# Patient Record
Sex: Male | Born: 1945 | Race: White | Hispanic: No | Marital: Married | Smoking: Former smoker
Health system: Southern US, Community
[De-identification: ages and names within clinical notes are randomized; demographics above are authoritative.]

## PROBLEM LIST (undated history)

## (undated) DIAGNOSIS — Z862 Personal history of diseases of the blood and blood-forming organs and certain disorders involving the immune mechanism: Secondary | ICD-10-CM

## (undated) DIAGNOSIS — I25810 Atherosclerosis of coronary artery bypass graft(s) without angina pectoris: Secondary | ICD-10-CM

## (undated) DIAGNOSIS — K219 Gastro-esophageal reflux disease without esophagitis: Secondary | ICD-10-CM

## (undated) DIAGNOSIS — E7800 Pure hypercholesterolemia, unspecified: Secondary | ICD-10-CM

## (undated) DIAGNOSIS — I4891 Unspecified atrial fibrillation: Secondary | ICD-10-CM

## (undated) DIAGNOSIS — J449 Chronic obstructive pulmonary disease, unspecified: Secondary | ICD-10-CM

## (undated) DIAGNOSIS — K5090 Crohn's disease, unspecified, without complications: Secondary | ICD-10-CM

## (undated) DIAGNOSIS — I10 Essential (primary) hypertension: Secondary | ICD-10-CM

## (undated) DIAGNOSIS — I6350 Cerebral infarction due to unspecified occlusion or stenosis of unspecified cerebral artery: Secondary | ICD-10-CM

## (undated) DIAGNOSIS — I82409 Acute embolism and thrombosis of unspecified deep veins of unspecified lower extremity: Secondary | ICD-10-CM

## (undated) DIAGNOSIS — N2889 Other specified disorders of kidney and ureter: Secondary | ICD-10-CM

## (undated) HISTORY — DX: Crohn's disease, unspecified, without complications: K50.90

## (undated) HISTORY — DX: Personal history of diseases of the blood and blood-forming organs and certain disorders involving the immune mechanism: Z86.2

## (undated) HISTORY — DX: Atherosclerosis of coronary artery bypass graft(s) without angina pectoris: I25.810

## (undated) HISTORY — DX: Pure hypercholesterolemia, unspecified: E78.00

## (undated) HISTORY — DX: Acute embolism and thrombosis of unspecified deep veins of unspecified lower extremity: I82.409

## (undated) HISTORY — DX: Essential (primary) hypertension: I10

## (undated) HISTORY — DX: Gastro-esophageal reflux disease without esophagitis: K21.9

## (undated) HISTORY — PX: OTHER SURGICAL HISTORY: SHX169

## (undated) HISTORY — DX: Other specified disorders of kidney and ureter: N28.89

## (undated) HISTORY — PX: REPLACEMENT TOTAL KNEE BILATERAL: SUR1225

## (undated) HISTORY — DX: Unspecified atrial fibrillation: I48.91

## (undated) HISTORY — DX: Chronic obstructive pulmonary disease, unspecified: J44.9

## (undated) HISTORY — DX: Cerebral infarction due to unspecified occlusion or stenosis of unspecified cerebral artery: I63.50

---

## 2008-08-13 HISTORY — PX: CORONARY ARTERY BYPASS GRAFT: SHX141

## 2008-08-23 ENCOUNTER — Encounter (INDEPENDENT_AMBULATORY_CARE_PROVIDER_SITE_OTHER): Payer: Self-pay | Admitting: Orthopedic Surgery

## 2008-08-24 ENCOUNTER — Ambulatory Visit: Payer: Self-pay | Admitting: Cardiology

## 2008-08-24 ENCOUNTER — Inpatient Hospital Stay (HOSPITAL_COMMUNITY): Admission: EM | Admit: 2008-08-24 | Discharge: 2008-09-11 | Payer: Self-pay | Admitting: Emergency Medicine

## 2008-08-27 DIAGNOSIS — I4891 Unspecified atrial fibrillation: Secondary | ICD-10-CM

## 2008-08-27 DIAGNOSIS — I82409 Acute embolism and thrombosis of unspecified deep veins of unspecified lower extremity: Secondary | ICD-10-CM | POA: Insufficient documentation

## 2008-08-28 ENCOUNTER — Ambulatory Visit: Payer: Self-pay | Admitting: Oncology

## 2008-08-28 ENCOUNTER — Ambulatory Visit: Payer: Self-pay | Admitting: Cardiothoracic Surgery

## 2008-08-28 ENCOUNTER — Encounter: Payer: Self-pay | Admitting: Cardiothoracic Surgery

## 2008-08-29 ENCOUNTER — Encounter: Payer: Self-pay | Admitting: Cardiothoracic Surgery

## 2008-09-02 DIAGNOSIS — I25810 Atherosclerosis of coronary artery bypass graft(s) without angina pectoris: Secondary | ICD-10-CM | POA: Insufficient documentation

## 2008-09-04 ENCOUNTER — Ambulatory Visit: Payer: Self-pay | Admitting: Cardiothoracic Surgery

## 2008-09-13 ENCOUNTER — Encounter: Admission: RE | Admit: 2008-09-13 | Discharge: 2008-09-13 | Payer: Self-pay | Admitting: Cardiothoracic Surgery

## 2008-09-13 ENCOUNTER — Ambulatory Visit: Payer: Self-pay | Admitting: Cardiothoracic Surgery

## 2008-09-23 ENCOUNTER — Encounter: Admission: RE | Admit: 2008-09-23 | Discharge: 2008-09-23 | Payer: Self-pay | Admitting: Cardiothoracic Surgery

## 2008-09-23 ENCOUNTER — Ambulatory Visit: Payer: Self-pay | Admitting: Cardiothoracic Surgery

## 2008-09-25 ENCOUNTER — Ambulatory Visit: Payer: Self-pay | Admitting: Cardiology

## 2008-09-27 ENCOUNTER — Ambulatory Visit: Payer: Self-pay | Admitting: Hematology & Oncology

## 2008-10-02 ENCOUNTER — Ambulatory Visit: Payer: Self-pay | Admitting: Cardiology

## 2008-10-10 ENCOUNTER — Ambulatory Visit: Payer: Self-pay | Admitting: Cardiology

## 2008-10-10 LAB — CONVERTED CEMR LAB
Chloride: 103 meq/L (ref 96–112)
Creatinine, Ser: 0.7 mg/dL (ref 0.4–1.5)
GFR calc Af Amer: 147 mL/min

## 2008-11-20 ENCOUNTER — Ambulatory Visit: Payer: Self-pay | Admitting: Internal Medicine

## 2008-11-20 ENCOUNTER — Encounter: Payer: Self-pay | Admitting: Urology

## 2008-11-20 ENCOUNTER — Inpatient Hospital Stay (HOSPITAL_COMMUNITY): Admission: AD | Admit: 2008-11-20 | Discharge: 2008-11-24 | Payer: Self-pay | Admitting: Urology

## 2008-11-26 ENCOUNTER — Observation Stay (HOSPITAL_COMMUNITY): Admission: AD | Admit: 2008-11-26 | Discharge: 2008-11-28 | Payer: Self-pay | Admitting: Urology

## 2008-12-11 IMAGING — CR DG ABD PORTABLE 1V
1 series · 1 of 1 positions shown · non-contrast
Comparison: CT abdomen 08/24/2008

CLINICAL DATA: Left renal mass

ABDOMEN - 1 VIEW

[view not recorded]
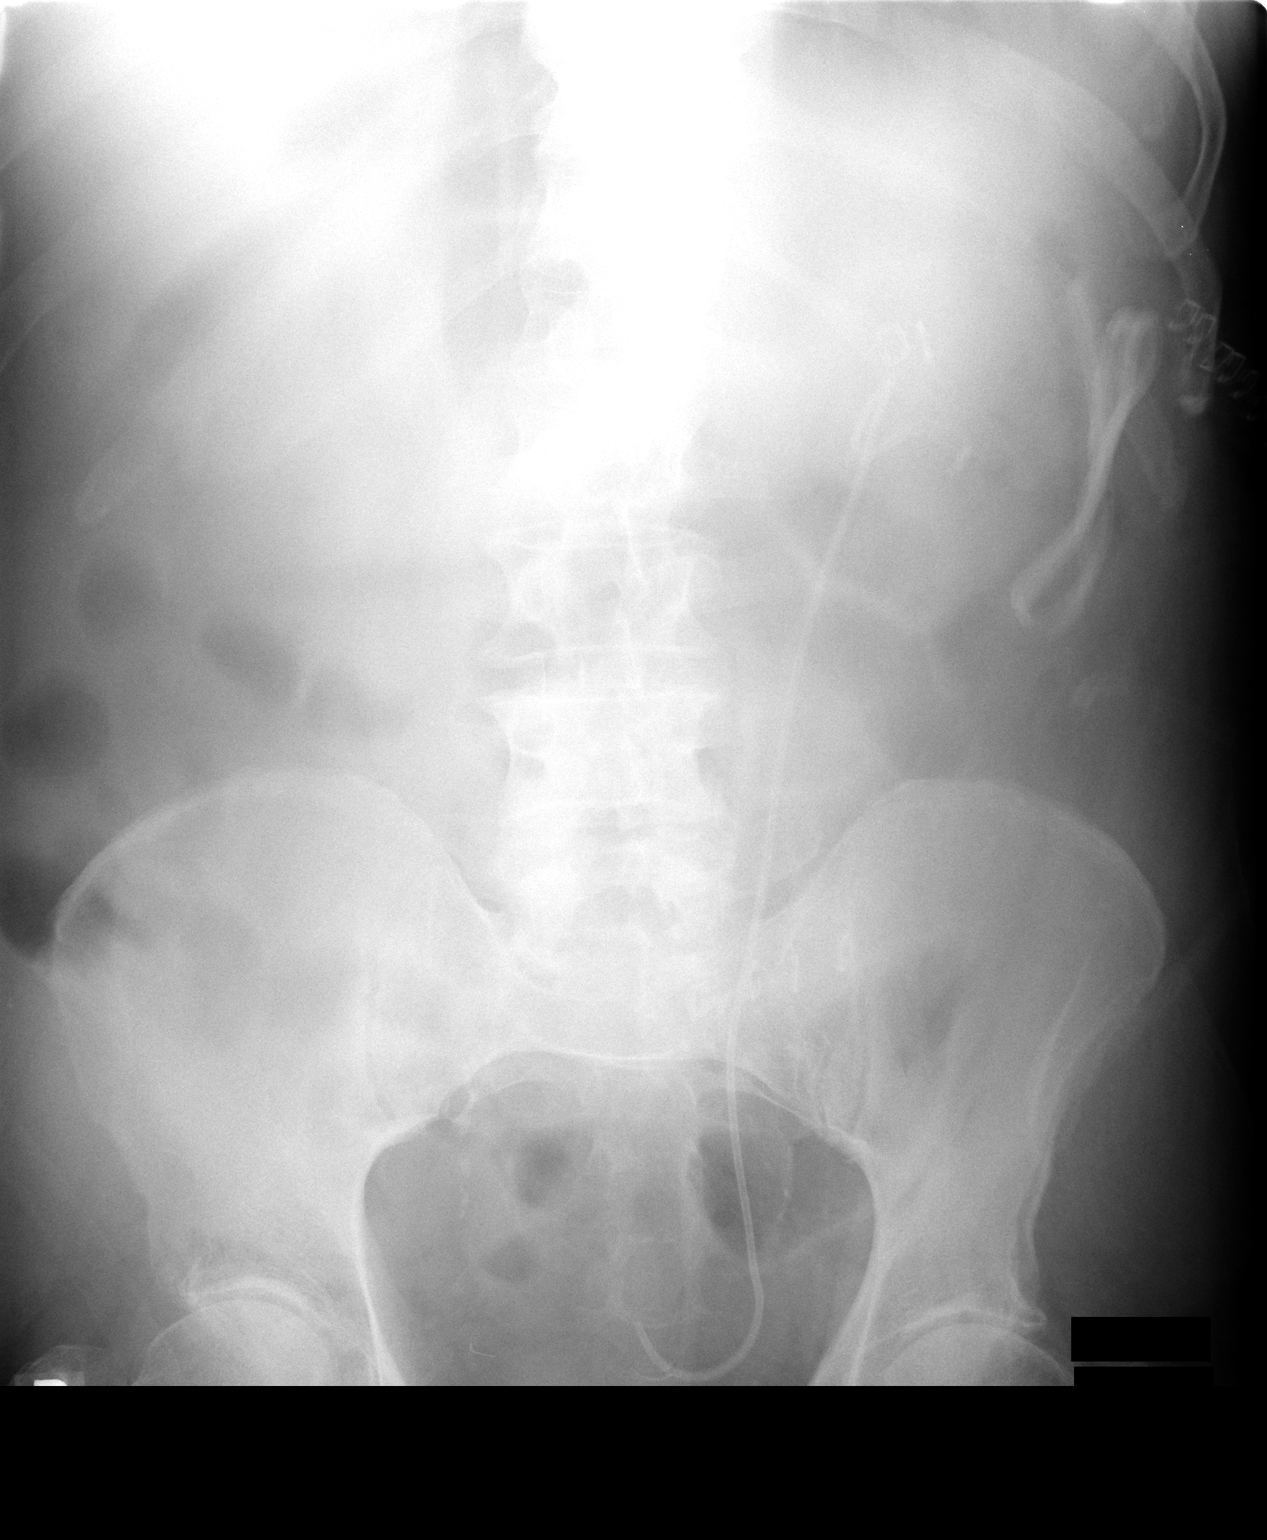

[1 of 1 positions shown; findings below may reference images not displayed]

FINDINGS: Interval placement of left double-J ureteral stent with
tip in the bladder.  Surgical drain lateral to the left kidney.
Skin  staples  in the left  lateral flank.
IMPRESSION: 1. Placement of left double  J ureteral stent.
2.  Surgical drain lateral to left kidney.

## 2008-12-17 IMAGING — CR DG ABDOMEN ACUTE W/ 1V CHEST
3 series · 3 of 3 positions shown · non-contrast
Comparison: 11/20/2008

CLINICAL DATA: Abdominal pain since surgery

ACUTE ABDOMEN SERIES (ABDOMEN 2 VIEW & CHEST 1 VIEW)

[w chest pa]
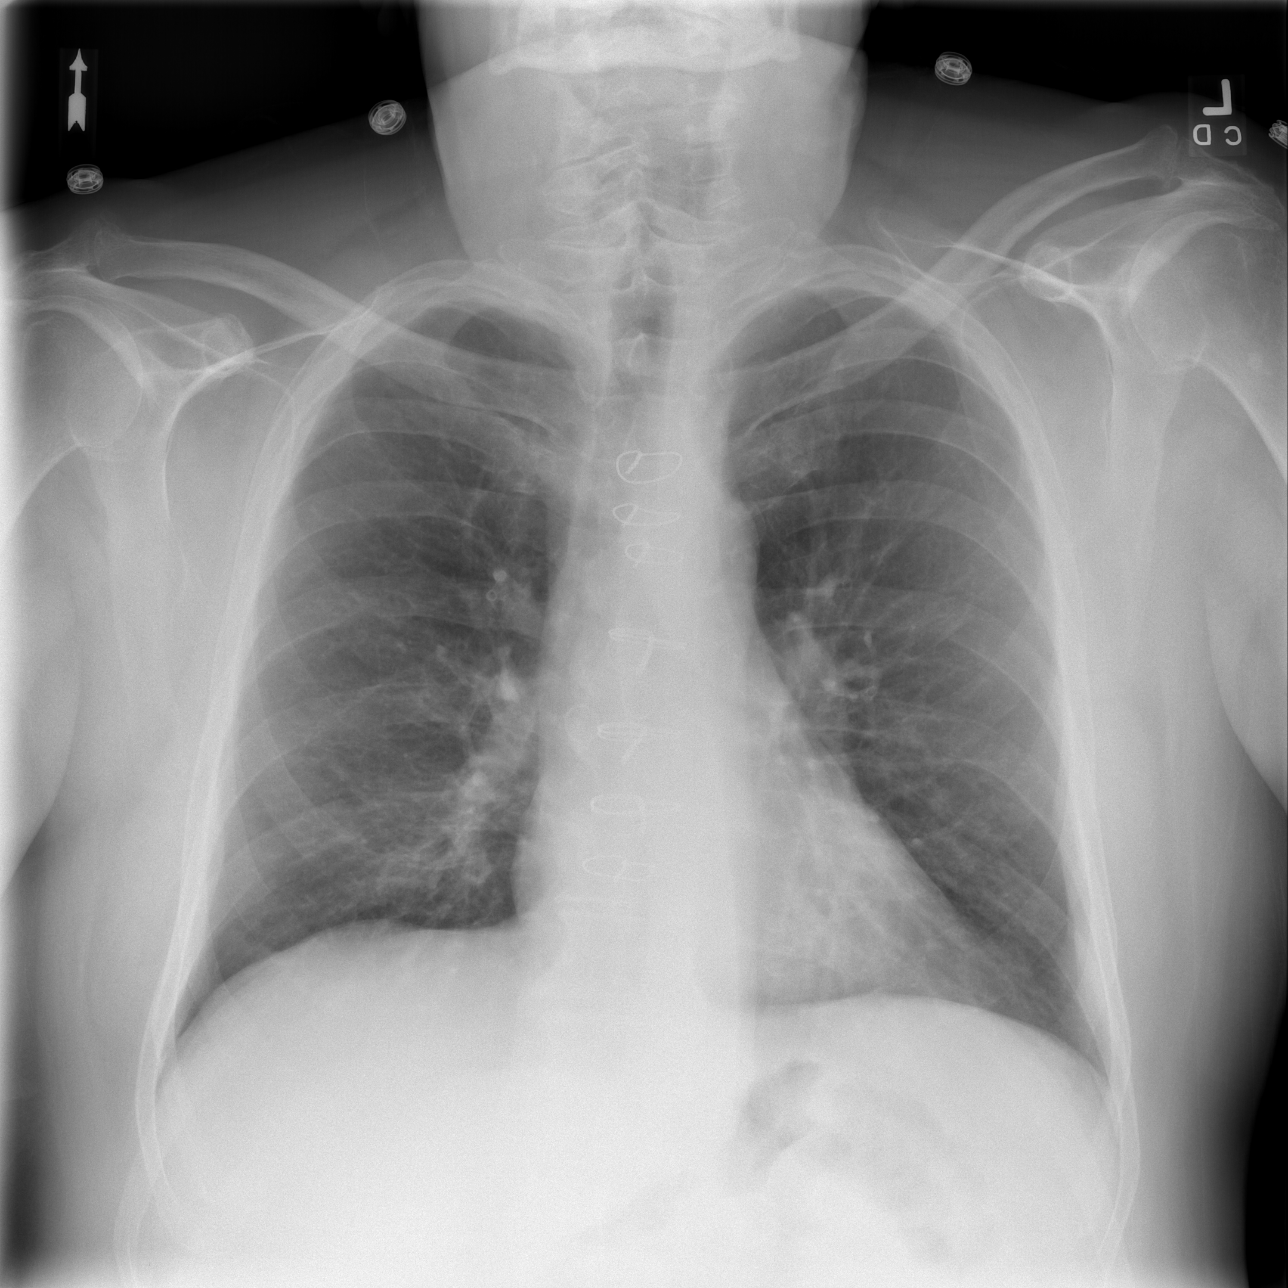

[w abdomen upright *]
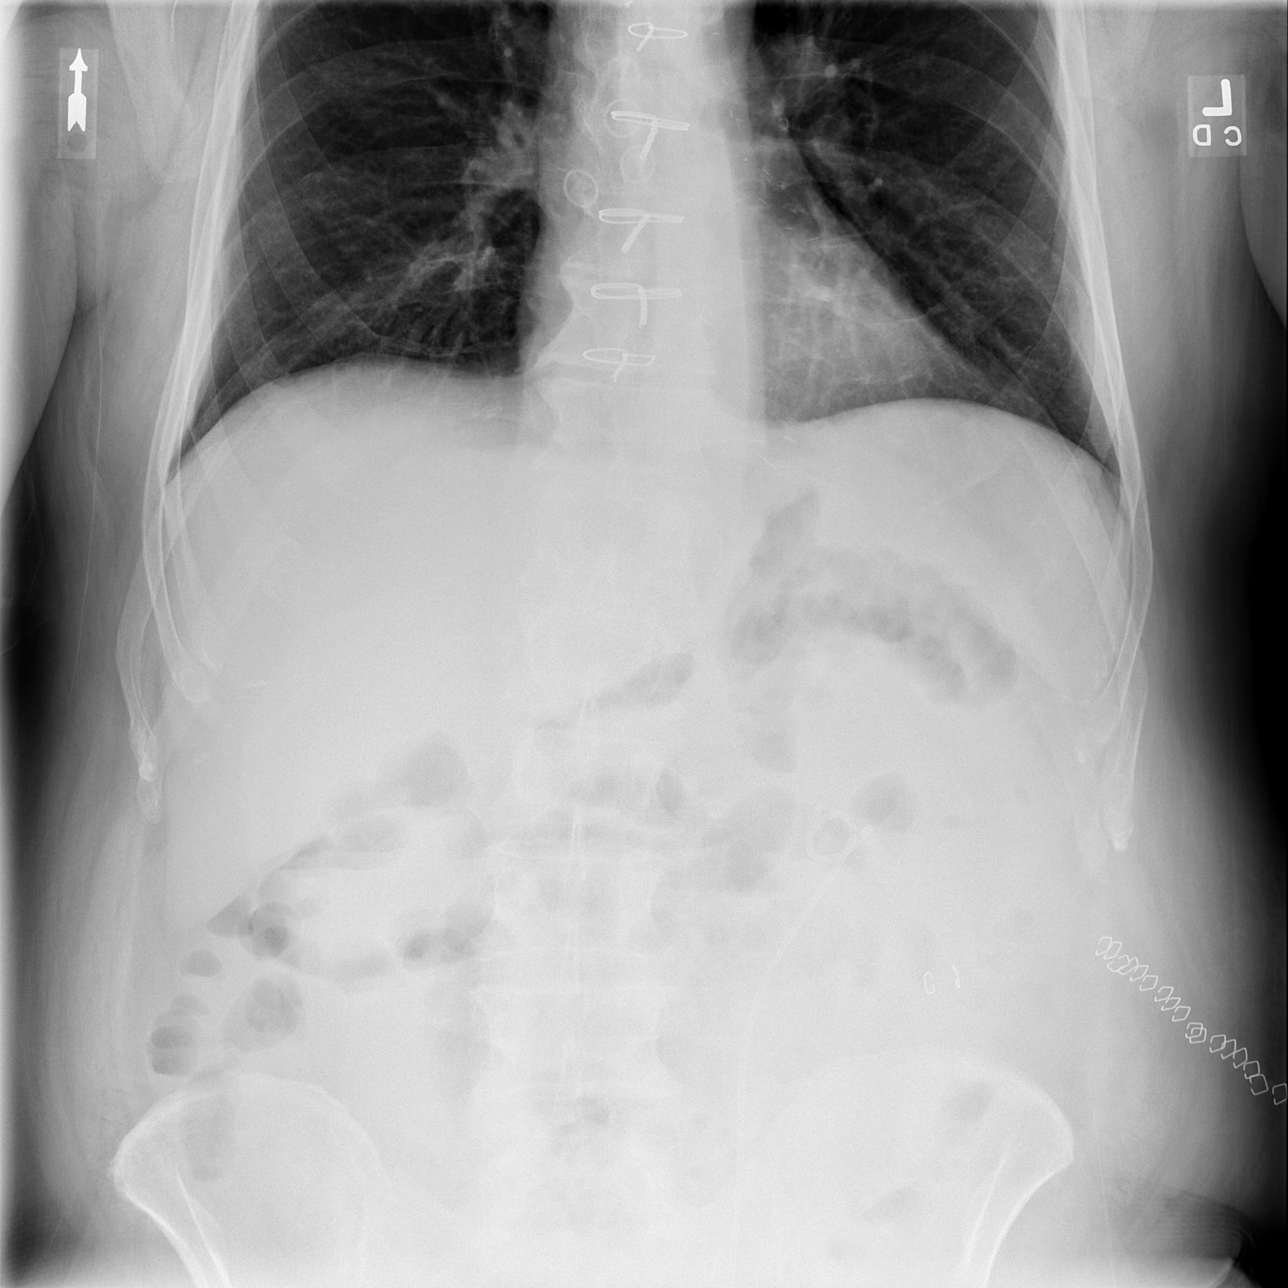

[t abdomen supine]
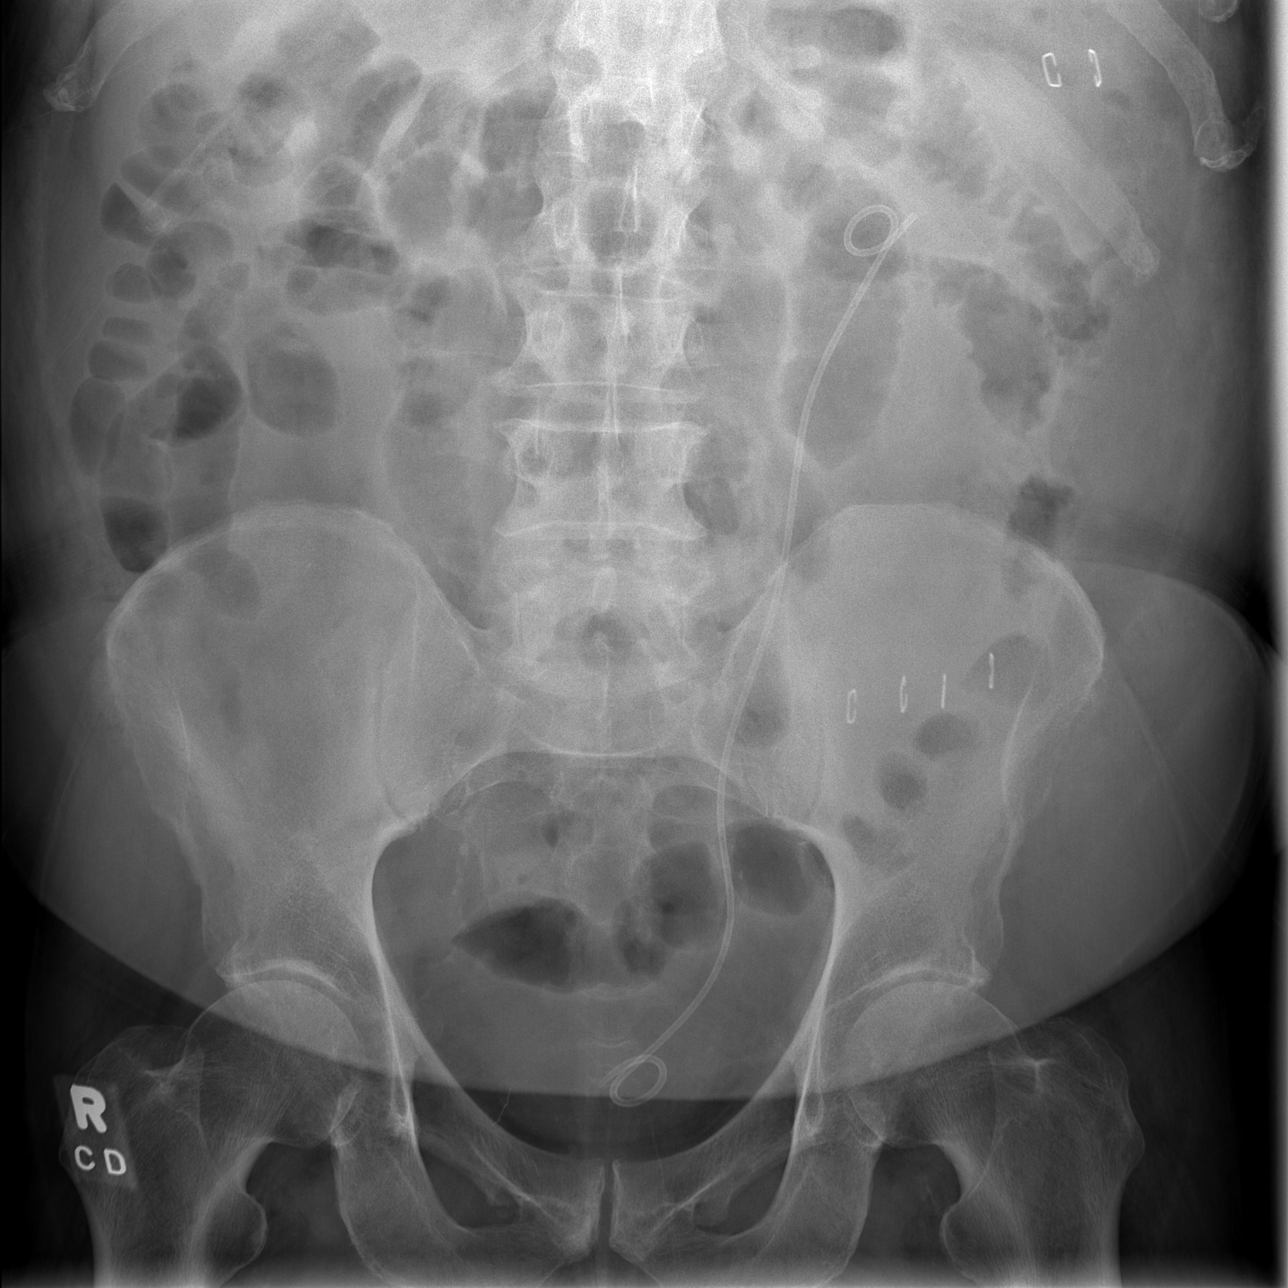

[3 of 3 positions shown; findings below may reference images not displayed]

FINDINGS: Double-J ureteral stent is in place on the left.  There
is gas in small and large bowel but no dilated loops.  The findings
could reflect a mild ileus, but this is not pronounced.  No free
air.  No worrisome calcifications or bony findings.

One-view chest shows previous CABG.  Lungs are clear.  The
vascularity is normal.  No free air.
IMPRESSION: Double-J ureteral stent grossly well positioned.  Gas pattern is
either normal or could possibly reflect a minimal postoperative
ileus.

## 2008-12-18 IMAGING — US US RENAL
1 series · 14 of 23 positions shown · non-contrast
Comparison: CT abdomen and pelvis 08/24/2008.

CLINICAL DATA: Status post repair of the left ureter.

RENAL/URINARY TRACT ULTRASOUND
TECHNIQUE: Complete ultrasound examination of the urinary tract
was performed including evaluation of the kidneys, renal collecting
systems, and urinary bladder.

[Series 1: unknown · 0.30mm/px · 14 of 23 slices shown]
[im 1/23]
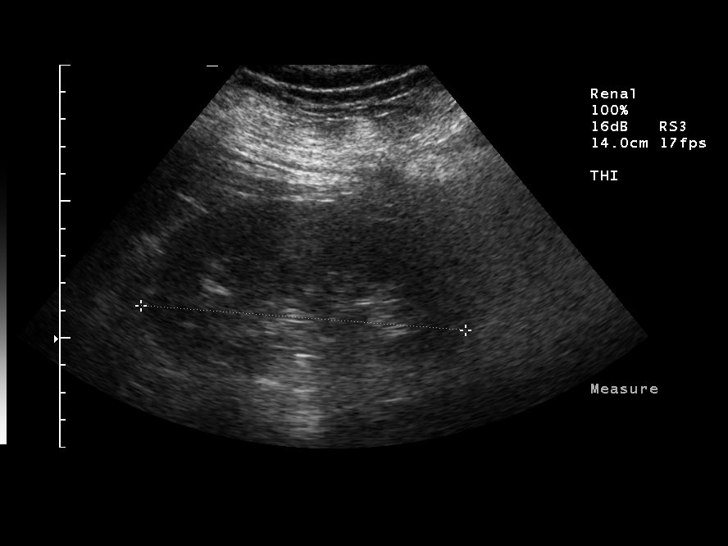
[im 3/23]
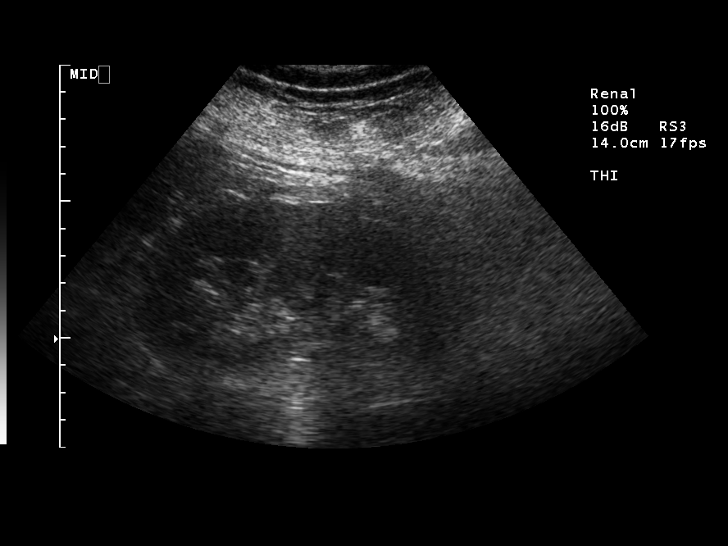
[im 5/23]
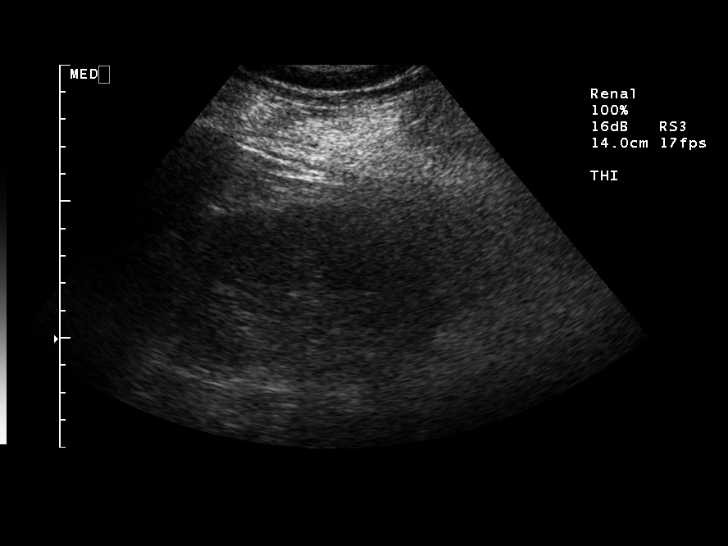
[im 6/23]
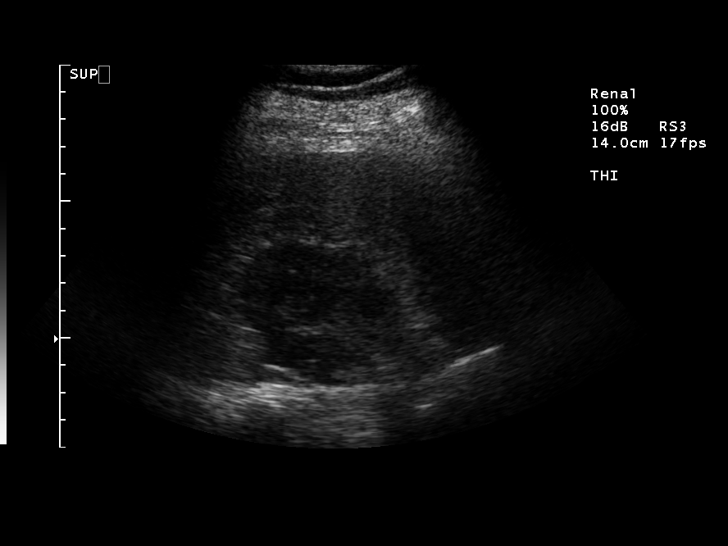
[im 8/23]
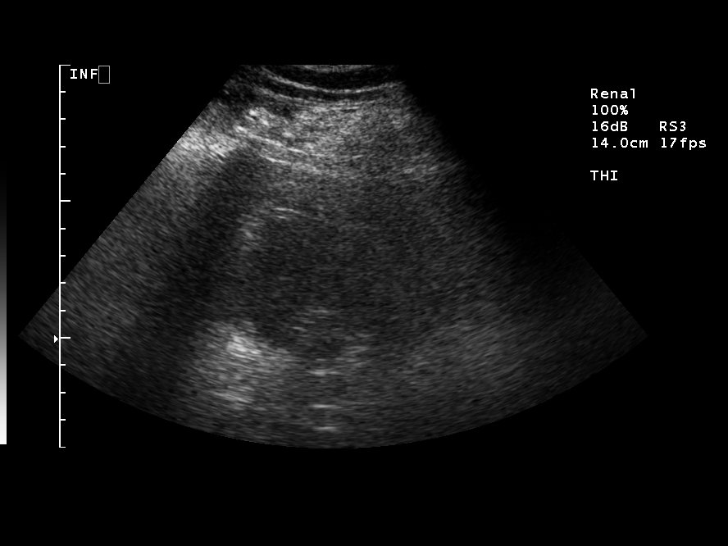
[im 10/23]
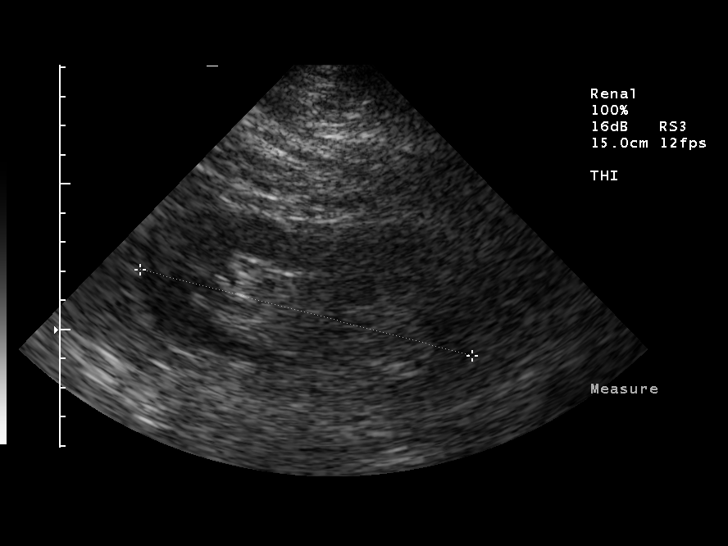
[im 11/23]
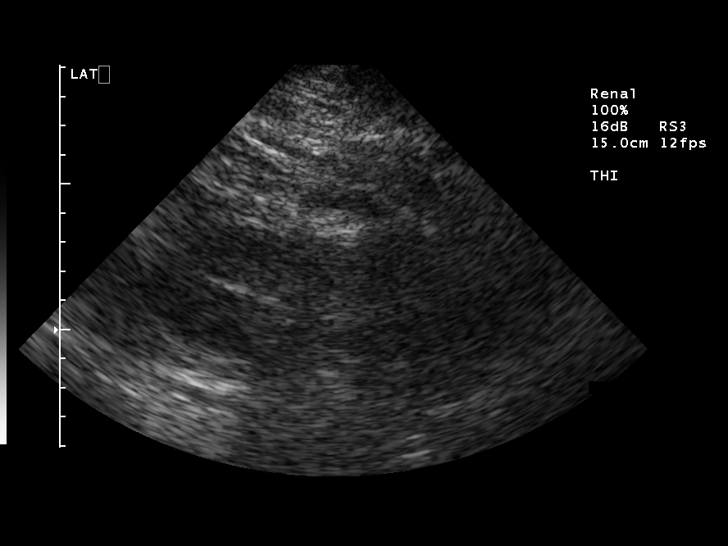
[im 13/23]
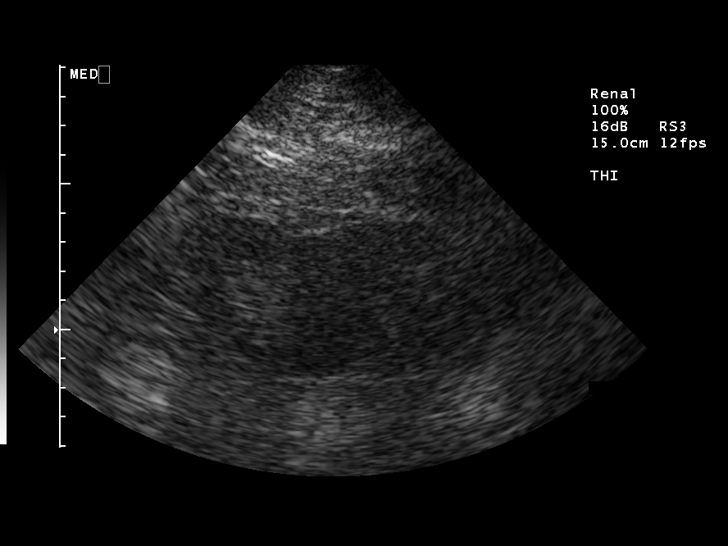
[im 14/23]
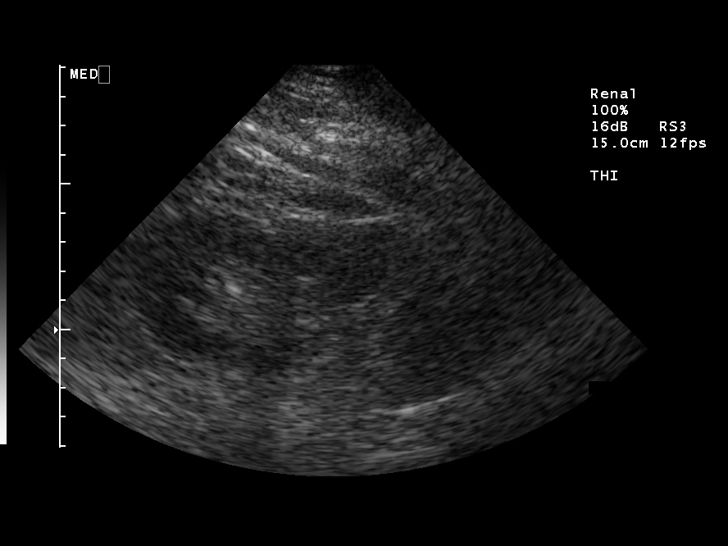
[im 16/23]
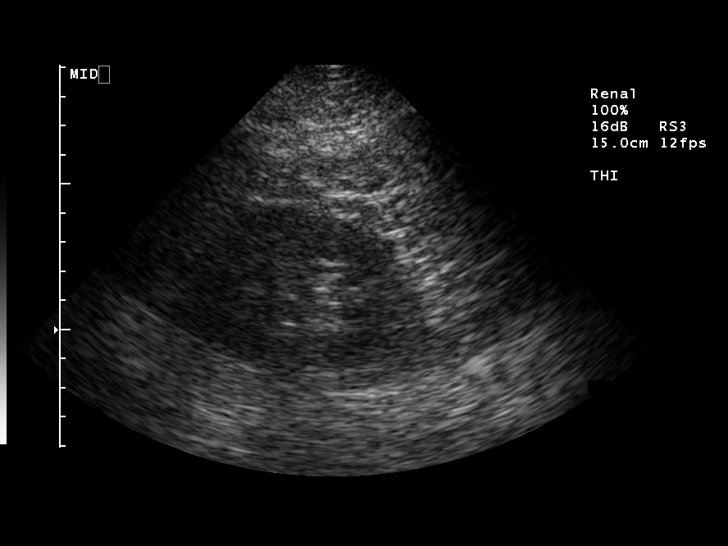
[im 18/23]
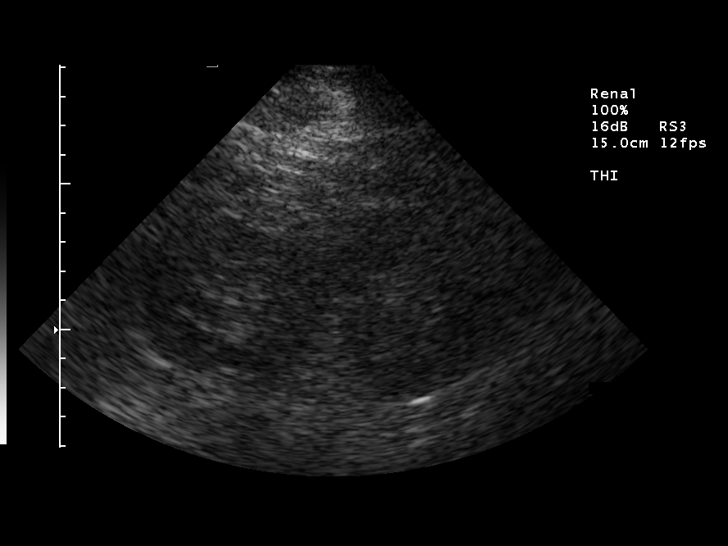
[im 19/23]
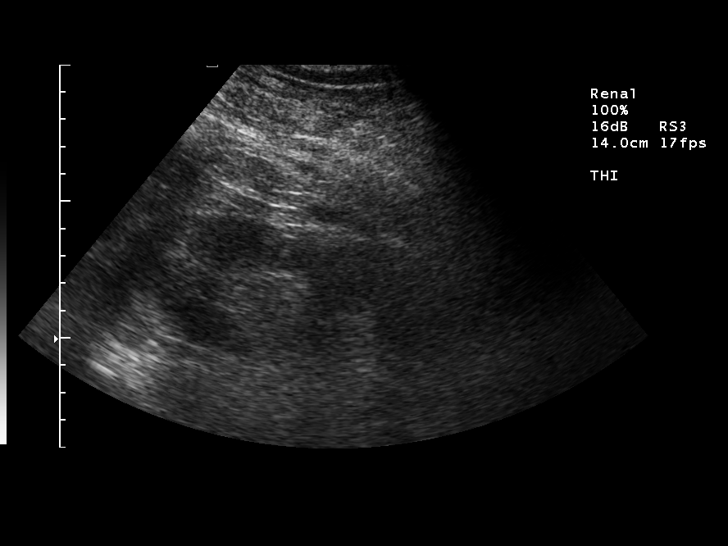
[im 21/23]
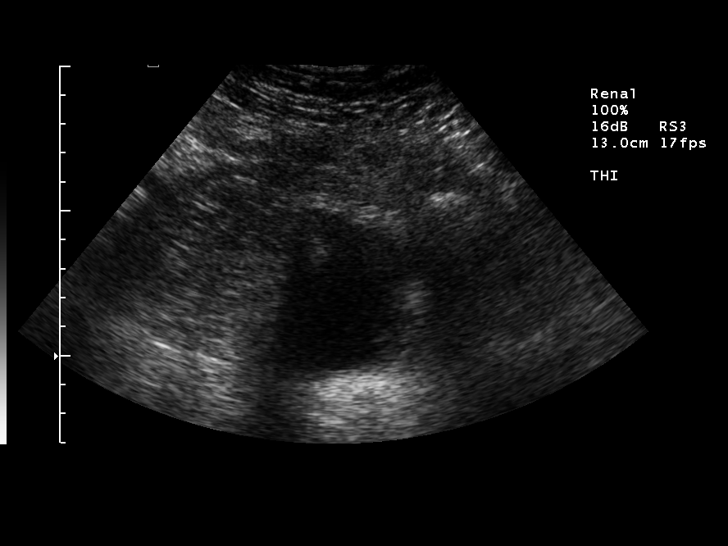
[im 23/23]
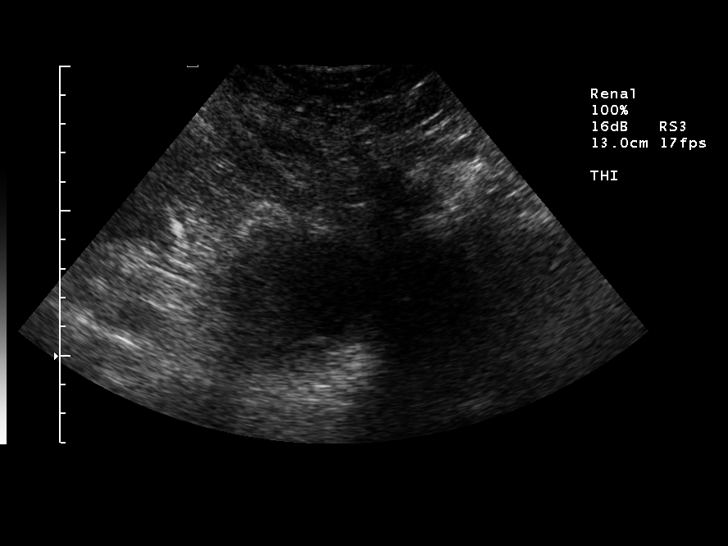

[14 of 23 positions shown; findings below may reference images not displayed]

FINDINGS: The right kidney measures 11.9 cm in left kidney measures
11.7 cm.  No focal fluid collection to suggest urinoma or abscess
is identified.  No renal mass, stone or hydronephrosis on either
the right or left.  Limited visualization of the urinary bladder is
unremarkable.
IMPRESSION: 1.  No acute finding.  Specifically, no evidence of urinoma.

## 2009-01-01 ENCOUNTER — Ambulatory Visit: Payer: Self-pay | Admitting: Cardiology

## 2009-01-01 DIAGNOSIS — I6350 Cerebral infarction due to unspecified occlusion or stenosis of unspecified cerebral artery: Secondary | ICD-10-CM | POA: Insufficient documentation

## 2009-01-01 DIAGNOSIS — K5090 Crohn's disease, unspecified, without complications: Secondary | ICD-10-CM | POA: Insufficient documentation

## 2009-01-01 DIAGNOSIS — K219 Gastro-esophageal reflux disease without esophagitis: Secondary | ICD-10-CM

## 2009-01-01 DIAGNOSIS — E7800 Pure hypercholesterolemia, unspecified: Secondary | ICD-10-CM

## 2009-01-10 ENCOUNTER — Ambulatory Visit: Payer: Self-pay | Admitting: Cardiology

## 2009-01-10 LAB — CONVERTED CEMR LAB
ALT: 21 units/L (ref 0–53)
AST: 20 units/L (ref 0–37)
Calcium: 9 mg/dL (ref 8.4–10.5)
Chloride: 103 meq/L (ref 96–112)
Creatinine, Ser: 0.7 mg/dL (ref 0.4–1.5)
GFR calc non Af Amer: 122 mL/min
Glucose, Bld: 109 mg/dL — ABNORMAL HIGH (ref 70–99)
LDL Cholesterol: 109 mg/dL — ABNORMAL HIGH (ref 0–99)
Pro B Natriuretic peptide (BNP): 109 pg/mL — ABNORMAL HIGH (ref 0.0–100.0)
Total CHOL/HDL Ratio: 4
Total Protein: 6.8 g/dL (ref 6.0–8.3)
Triglycerides: 141 mg/dL (ref 0–149)

## 2009-02-26 ENCOUNTER — Encounter: Payer: Self-pay | Admitting: Cardiology

## 2009-02-26 ENCOUNTER — Ambulatory Visit: Payer: Self-pay | Admitting: Cardiology

## 2009-03-07 ENCOUNTER — Ambulatory Visit: Payer: Self-pay | Admitting: Cardiology

## 2009-03-07 LAB — CONVERTED CEMR LAB
Calcium: 8.3 mg/dL — ABNORMAL LOW (ref 8.4–10.5)
Creatinine, Ser: 0.9 mg/dL (ref 0.4–1.5)
GFR calc non Af Amer: 90.91 mL/min (ref >60)
Potassium: 4.1 meq/L (ref 3.5–5.1)
Sodium: 142 meq/L (ref 135–145)

## 2009-05-19 ENCOUNTER — Telehealth: Payer: Self-pay | Admitting: Cardiology

## 2009-07-07 ENCOUNTER — Encounter (INDEPENDENT_AMBULATORY_CARE_PROVIDER_SITE_OTHER): Payer: Self-pay | Admitting: *Deleted

## 2009-07-23 ENCOUNTER — Encounter: Payer: Self-pay | Admitting: Cardiology

## 2009-07-23 ENCOUNTER — Ambulatory Visit: Payer: Self-pay | Admitting: Cardiology

## 2009-07-23 DIAGNOSIS — J4489 Other specified chronic obstructive pulmonary disease: Secondary | ICD-10-CM | POA: Insufficient documentation

## 2009-07-23 DIAGNOSIS — I509 Heart failure, unspecified: Secondary | ICD-10-CM

## 2009-07-23 DIAGNOSIS — J449 Chronic obstructive pulmonary disease, unspecified: Secondary | ICD-10-CM

## 2009-07-23 DIAGNOSIS — I10 Essential (primary) hypertension: Secondary | ICD-10-CM

## 2009-07-29 ENCOUNTER — Encounter: Payer: Self-pay | Admitting: Cardiology

## 2009-08-01 ENCOUNTER — Encounter (INDEPENDENT_AMBULATORY_CARE_PROVIDER_SITE_OTHER): Payer: Self-pay | Admitting: *Deleted

## 2009-08-05 ENCOUNTER — Telehealth: Payer: Self-pay | Admitting: Cardiology

## 2010-01-20 ENCOUNTER — Telehealth: Payer: Self-pay | Admitting: Cardiology

## 2010-01-20 ENCOUNTER — Encounter: Payer: Self-pay | Admitting: Cardiology

## 2010-02-04 ENCOUNTER — Encounter: Payer: Self-pay | Admitting: Cardiology

## 2010-02-04 ENCOUNTER — Ambulatory Visit: Payer: Self-pay | Admitting: Cardiology

## 2010-07-15 ENCOUNTER — Encounter: Payer: Self-pay | Admitting: Cardiology

## 2010-07-15 ENCOUNTER — Ambulatory Visit: Payer: Self-pay | Admitting: Cardiology

## 2010-07-16 ENCOUNTER — Encounter (INDEPENDENT_AMBULATORY_CARE_PROVIDER_SITE_OTHER): Payer: Self-pay | Admitting: *Deleted

## 2010-07-16 LAB — CONVERTED CEMR LAB
BUN: 15 mg/dL (ref 6–23)
Bilirubin, Direct: 0.1 mg/dL (ref 0.0–0.3)
CO2: 29 meq/L (ref 19–32)
Calcium: 9.8 mg/dL (ref 8.4–10.5)
Cholesterol: 203 mg/dL — ABNORMAL HIGH (ref 0–200)
Indirect Bilirubin: 0.4 mg/dL (ref 0.0–0.9)
Potassium: 4.8 meq/L (ref 3.5–5.3)
Sodium: 137 meq/L (ref 135–145)
Total Bilirubin: 0.5 mg/dL (ref 0.3–1.2)
Total CHOL/HDL Ratio: 4.5
Triglycerides: 237 mg/dL — ABNORMAL HIGH (ref <150)

## 2011-01-12 NOTE — Assessment & Plan Note (Signed)
Summary: [facility]   Visit Type:  Follow-up  CC:  No complains.  History of Present Illness: Mr. Pope is a pleasant gentleman who has history of coronary artery disease, status post coronary bypassing graft in September 2009.  His LV function is preserved. He was also found to have a DVT on that admission and was placed on Coumadin. There is a strong family history of protein S deficiency. The patient was seen by Dr. Myna Hidalgo and Coumadin for one year was recommended. After Coumadin is discontinued  a protein S level will be drawn 2 months afterwards. If positive then he will need lifelong Coumadin. He also was found to have a renal mass, which was ultimately resected and found to be benign by his report.  I last saw him in August of 2010. Since then he has dyspnea with more extreme activities but not with routine activities. It is relieved with rest. There is no associated chest pain. There is no orthopnea or PND. His pedal edema is controlled with his present dose of diuretic. He does not have exertional chest pain and there is no syncope. Note he did have blood drawn recently 3 months after discontinuing Coumadin. He did not have a protein S or protein C deficiency. His potassium was 4.4 and his creatinine was 0.78.  Current Medications (verified): 1)  Folic Acid 800 Mcg Tabs (Folic Acid) .... Take 1 Tablet By Mouth Once A Day 2)  Metoprolol Tartrate 100 Mg Tabs (Metoprolol Tartrate) .... Take 1 Tablet By Mouth Two Times A Day 3)  Ranitidine Hcl 150 Mg Caps (Ranitidine Hcl) .... Take 1 Capsule By Mouth Two Times A Day 4)  Aspirin 81 Mg Tbec (Aspirin) .... Take One Tablet By Mouth Daily 5)  Furosemide 40 Mg Tabs (Furosemide) .... Take One Tablet By Mouth Every Am and 1/2 Tablet At 2pm Daily 6)  Klor-Con M20 20 Meq Cr-Tabs (Potassium Chloride Crys Cr) .... Take 1 Tablet By Mouth Once A Day 7)  Lisinopril 20 Mg Tabs (Lisinopril) .Marland Kitchen.. 1 Tablet By Mouth Once A Day 8)  Pravastatin Sodium 40  Mg Tabs (Pravastatin Sodium) .... Take Two  Tablets  By Mouth Daily At Bedtime  Allergies (verified): No Known Drug Allergies  Past History:  Past Medical History: CAD, ARTERY BYPASS GRAFT (ICD-414.04) HYPERCHOLESTEROLEMIA  IIA (ICD-272.0) CVA (ICD-434.91) 15 years ago CROHN'S DISEASE (ICD-555.9) with rectal bleeding- dx 3 years ago DVT (ICD-453.40) GERD (ICD-530.81) Atrial Fib post CABG History of left renal mass status post resection with results being benign. Hypertension Strong family history of protein S deficiency COPD  Past Surgical History: bilateral knee replacement in 11/08 and 6/08 coronary artery bypass and graft in September of 2009 with a LIMA to the LAD, vein graft to the diagonal, saphenous vein graft to the acute marginal and a saphenous vein graft to the PDA). Prior resection of a renal mass  Social History: Reviewed history from 07/23/2009 and no changes required. Married lives in [place] Tobacco Use - Yes. 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 Remaining systems are negative.   Vital Signs:  Patient profile:   65 year old male Height:      66 inches Weight:      255 pounds BMI:  41.31 Pulse rate:   84 / minute Pulse rhythm:   regular Resp:     18 per minute BP sitting:   140 / 90  (right arm) Cuff size:   large  Vitals Entered By: Vikki Ports (February 04, 2010 10:34 AM)  Physical Exam  General:  Well-developed obese in no acute distress.  Skin is warm and dry.  HEENT is normal.  Neck is supple. No thyromegaly.  Chest is clear to auscultation with normal expansion.  Cardiovascular exam is regular rate and rhythm.  Abdominal exam nontender or distended. No masses  palpated. Extremities show trace edema. neuro grossly intact    EKG  Procedure date:  02/04/2010  Findings:      Normal sinus rhythm at a rate of 84. Axis normal. No ST changes.  Impression & Recommendations:  Problem # 1:  CAD, ARTERY BYPASS GRAFT (ICD-414.04) Continue aspirin, ACE inhibitor, beta blocker and statin. Risk factor modification discussed including diet and exercise. The following medications were removed from the medication list:    Warfarin Sodium 5 Mg Tabs (Warfarin sodium) .Marland Kitchen... As directed His updated medication list for this problem includes:    Metoprolol Tartrate 100 Mg Tabs (Metoprolol tartrate) .Marland Kitchen... Take 1 tablet by mouth two times a day    Aspirin 81 Mg Tbec (Aspirin) .Marland Kitchen... Take one tablet by mouth daily    Lisinopril 20 Mg Tabs (Lisinopril) .Marland Kitchen... 1 tablet by mouth once a day  The following medications were removed from the medication list:    Warfarin Sodium 5 Mg Tabs (Warfarin sodium) .Marland Kitchen... As directed His updated medication list for this problem includes:    Metoprolol Tartrate 100 Mg Tabs (Metoprolol tartrate) .Marland Kitchen... Take 1 tablet by mouth two times a day    Aspirin 81 Mg Tbec (Aspirin) .Marland Kitchen... Take one tablet by mouth daily    Lisinopril 20 Mg Tabs (Lisinopril) .Marland Kitchen... 1 tablet by mouth once a day  Problem # 2:  ESSENTIAL HYPERTENSION, BENIGN (ICD-401.1) Blood pressure elevated. I have asked him to follow this at home. We will increase his lisinopril if his systolic blood pressure runs higher than 130 or if his diastolic runs higher than 85. His updated medication list for this problem includes:    Metoprolol Tartrate 100 Mg Tabs (Metoprolol tartrate) .Marland Kitchen... Take 1 tablet by mouth two times a day    Aspirin 81 Mg Tbec (Aspirin) .Marland Kitchen... Take one tablet by mouth daily    Furosemide 40 Mg Tabs (Furosemide) .Marland Kitchen... Take one tablet by mouth every am and 1/2 tablet at 2pm daily    Lisinopril 20 Mg Tabs (Lisinopril) .Marland Kitchen... 1 tablet by mouth once a day  Problem # 3:   CHF (ICD-428.0) Patient euvolemic on examination. He has diastolic congestive heart failure. Continue present dose of diuretics. The following medications were removed from the medication list:    Warfarin Sodium 5 Mg Tabs (Warfarin sodium) .Marland Kitchen... As directed His updated medication list for this problem includes:    Metoprolol Tartrate 100 Mg Tabs (Metoprolol tartrate) .Marland Kitchen... Take 1 tablet by mouth two times a day    Aspirin 81 Mg Tbec (Aspirin) .Marland Kitchen... Take one tablet by mouth daily    Furosemide 40 Mg Tabs (Furosemide) .Marland Kitchen... Take one tablet by mouth every am and 1/2 tablet at 2pm daily    Lisinopril 20 Mg Tabs (Lisinopril) .Marland Kitchen... 1 tablet by mouth once a day  Problem # 4:  COPD (ICD-496)  Problem # 5:  CROHN'S DISEASE (ICD-555.9)  Problem # 6:  HYPERCHOLESTEROLEMIA  IIA (ICD-272.0) Continue statin. He cannot afford Crestor. He understands that he is not at goal with Pravachol. His updated medication list for this problem includes:    Pravastatin Sodium 40 Mg Tabs (Pravastatin sodium) .Marland Kitchen... Take two  tablets  by mouth daily at bedtime  Problem # 7:  DVT (ICD-453.40) Patient completed his course of Coumadin. His laboratories show no protein S or protein C deficiency.  Patient Instructions: 1)  Your physician recommends that you schedule a follow-up appointment in: 6 MONTHS   Medication Administration  Injection # 1:    Medication: FLU SHOT    Diagnosis: CHF (ICD-428.0)    Route: IM    Site: L deltoid    Exp Date: 06/11/2010    Lot #: [id number]    Mfr: [email or web address]    Patient tolerated injection without complications    Given by: Deliah Goody, RN (February 04, 2010 11:19 AM)

## 2011-01-12 NOTE — Letter (Signed)
Summary: [facility]  [facility], Main Office  [address]   [place], [place] [address]   Phone: [phone]  Fax: [phone]     July 16, 2010 MRN: [id number]   [name] [address] [place], [place]  [address]   Dear Mr. BABE,  We have reviewed your cholesterol results.  They are as follows:     Total Cholesterol:    203 (Desirable: less than 200)       HDL  Cholesterol:     45  (Desirable: greater than 40 for men and 50 for women)       LDL Cholesterol:       111  (Desirable: less than 100 for low risk and less than 70 for moderate to high risk)       Triglycerides:       237  (Desirable: less than 150)  Our recommendations include:These numbers look good. Continue on the same medicine. Sodium, potassium,kidney and Liver function are normal. Take care, Dr. Darel Hong.    Call our office at the number listed above if you have any questions.  Lowering your LDL cholesterol is important, but it is only one of a large number of "risk factors" that may indicate that you are at risk for heart disease, stroke or other complications of hardening of the arteries.  Other risk factors include:   A.  Cigarette Smoking* B.  High Blood Pressure* C.  Obesity* D.   Low HDL Cholesterol (see yours above)* E.   Diabetes Mellitus (higher risk if your is uncontrolled) F.  Family history of premature heart disease G.  Previous history of stroke or cardiovascular disease    *These are risk factors YOU HAVE CONTROL OVER.  For more information, visit .  There is now evidence that lowering the TOTAL CHOLESTEROL AND LDL CHOLESTEROL can reduce the risk of heart disease.  The American Heart Association recommends the following guidelines for the treatment of elevated cholesterol:  1.  If there is now current heart disease and less than two risk factors, TOTAL CHOLESTEROL should be less than 200 and LDL CHOLESTEROL should be less than 100. 2.  If there is  current heart disease or two or more risk factors, TOTAL CHOLESTEROL should be less than 200 and LDL CHOLESTEROL should be less than 70.  A diet low in cholesterol, saturated fat, and calories is the cornerstone of treatment for elevated cholesterol.  Cessation of smoking and exercise are also important in the management of elevated cholesterol and preventing vascular disease.  Studies have shown that 30 to 60 minutes of physical activity most days can help lower blood pressure, lower cholesterol, and keep your weight at a healthy level.  Drug therapy is used when cholesterol levels do not respond to therapeutic lifestyle changes (smoking cessation, diet, and exercise) and remains unacceptably high.  If medication is started, it is important to have you levels checked periodically to evaluate the need for further treatment options.  Thank you,    [employer] Team

## 2011-01-12 NOTE — Progress Notes (Signed)
Summary: lab questions  Phone Note From Other Clinic   Caller: nurse tammy Summary of Call: pt in office to have labs drawn. nurse has question on protein s protein c Liver. [phone] ask for tammy Initial call taken by: Edman Circle,  January 20, 2010 9:27 AM  Follow-up for Phone Call        I called and spoke with Tammy. The pt has a lab order from our office for Protein S and Protein C to be done with a liver test. She wanted to clarify which protein S/ C test were needed- there is protein S anitgen/ deficiency profile/functional panels and the same for protein C. I called and spoke with Dr. Jens Som. He wants the Protein C and Protein S deficiency profiles. Tammy is aware and she will get a liver function panel as well.  Follow-up by: Sherri Rad, RN, BSN,  January 20, 2010 9:45 AM

## 2011-01-12 NOTE — Assessment & Plan Note (Signed)
Summary: [facility]   Visit Type:  6 months follow up  CC:  Sob.  History of Present Illness: [name] is a pleasant gentleman who has history of coronary artery disease, status post coronary bypassing graft in September 2009.  His LV function is preserved. He was also found to have a DVT on that admission and was placed on Coumadin. There is a strong family history of protein S deficiency. The patient was seen by Dr. Myna Hidalgo and Coumadin for one year was recommended. Note he did have blood drawn after discontinuing Coumadin. He did not have a protein S or protein C deficiency. He also was found to have a renal mass, which was ultimately resected and found to be benign by his report.  I last saw him in Feb 2011. Since then, the patient has dyspnea with more extreme activities but not with routine activities. It is relieved with rest. It is not associated with chest pain. There is no orthopnea, PND or pedal edema. There is no syncope or palpitations. There is no exertional chest pain.   Current Medications (verified): 1)  Folic Acid 800 Mcg Tabs (Folic Acid) .... Take 1 Tablet By Mouth Once A Day 2)  Metoprolol Tartrate 100 Mg Tabs (Metoprolol Tartrate) .... Take 1 Tablet By Mouth Two Times A Day 3)  Ranitidine Hcl 150 Mg Caps (Ranitidine Hcl) .... Take 1 Capsule By Mouth Two Times A Day 4)  Aspirin 81 Mg Tbec (Aspirin) .... Take One Tablet By Mouth Daily 5)  Furosemide 40 Mg Tabs (Furosemide) .... Take One Tablet By Mouth Every Am and 1/2 Tablet At 2pm Daily 6)  Klor-Con M20 20 Meq Cr-Tabs (Potassium Chloride Crys Cr) .... Take 1 Tablet By Mouth Once A Day 7)  Lisinopril 20 Mg Tabs (Lisinopril) .Marland Kitchen.. 1 Tablet By Mouth Once A Day 8)  Pravastatin Sodium 40 Mg Tabs (Pravastatin Sodium) .... Take Two  Tablets  By Mouth Daily At Bedtime 9)  Sulfasalazine 500 Mg Tabs (Sulfasalazine) .... Take 2 Tablets Two Times A Day  Allergies (verified): No Known Drug Allergies  Past History:  Past  Medical History: Reviewed history from 02/04/2010 and no changes required. CAD, ARTERY BYPASS GRAFT (ICD-414.04) HYPERCHOLESTEROLEMIA  IIA (ICD-272.0) CVA (ICD-434.91) 15 years ago CROHN'S DISEASE (ICD-555.9) with rectal bleeding- dx 3 years ago DVT (ICD-453.40) GERD (ICD-530.81) Atrial Fib post CABG History of left renal mass status post resection with results being benign. Hypertension Strong family history of protein S deficiency COPD  Past Surgical History: Reviewed history from 02/04/2010 and no changes required. bilateral knee replacement in 11/08 and 6/08 coronary artery bypass and graft in September of 2009 with a LIMA to the LAD, vein graft to the diagonal, saphenous vein graft to the acute marginal and a saphenous vein graft to the PDA). Prior resection of a renal mass  Social History: Reviewed history from 07/23/2009 and no changes required. Married lives in [place] Tobacco Use - Yes. 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 Remaining systems are negative.   Vital Signs:  Patient profile:   65 year old male Height:      66 inches Weight:      247.75 pounds BMI:     40.13 Pulse rate:  77 / minute Pulse rhythm:   regular Resp:     20 per minute BP sitting:   128 / 80  (left arm) Cuff size:   large  Vitals Entered By: Vikki Ports (July 15, 2010 8:41 AM)  Physical Exam  General:  Well-developed well-nourished in no acute distress.  Skin is warm and dry.  HEENT is normal.  Neck is supple. No thyromegaly.  Chest is clear to auscultation with normal expansion.  Cardiovascular exam is regular rate and rhythm.  Abdominal exam nontender or distended. No masses  palpated. Extremities show no edema. neuro grossly intact    EKG  Procedure date:  07/15/2010  Findings:      Normal sinus rhythm at a rate of 77. Access to the right. No ST changes.  Impression & Recommendations:  Problem # 1:  ESSENTIAL HYPERTENSION, BENIGN (ICD-401.1)  Blood pressure controlled on present medications; will continue. Check potassium and renal function. His updated medication list for this problem includes:    Metoprolol Tartrate 100 Mg Tabs (Metoprolol tartrate) .Marland Kitchen... Take 1 tablet by mouth two times a day    Aspirin 81 Mg Tbec (Aspirin) .Marland Kitchen... Take one tablet by mouth daily    Furosemide 40 Mg Tabs (Furosemide) .Marland Kitchen... Take one tablet by mouth every am and 1/2 tablet at 2pm daily    Lisinopril 20 Mg Tabs (Lisinopril) .Marland Kitchen... 1 tablet by mouth once a day  Orders: T-Basic Metabolic Panel [id number])  Problem # 2:  CAD, ARTERY BYPASS GRAFT (ICD-414.04) Continue aspirin, beta blocker, ACE inhibitor and statin. Discussed risk factor modification including diet and exercise. His updated medication list for this problem includes:    Metoprolol Tartrate 100 Mg Tabs (Metoprolol tartrate) .Marland Kitchen... Take 1 tablet by mouth two times a day    Aspirin 81 Mg Tbec (Aspirin) .Marland Kitchen... Take one tablet by mouth daily    Lisinopril 20 Mg Tabs (Lisinopril) .Marland Kitchen... 1 tablet by mouth once a day  Problem # 3:  HYPERCHOLESTEROLEMIA  IIA (ICD-272.0)  Continue statin. Check lipids and liver. Patient cannot afford Crestor. His updated medication list for this problem includes:    Pravastatin Sodium 40 Mg Tabs (Pravastatin sodium) .Marland Kitchen... Take two  tablets  by mouth daily at bedtime  His updated medication list for this problem includes:    Pravastatin Sodium 40 Mg Tabs (Pravastatin sodium) .Marland Kitchen... Take two  tablets  by mouth daily at bedtime  Orders: T-Hepatic Function [id number]) T-Lipid Profile [id number])  Problem # 4:  CROHN'S DISEASE (ICD-555.9)  Problem # 5:  COPD (ICD-496) Pt does  complain of dyspnea. I think this is most likely secondary to COPD. He is not volume overloaded on examination. Check BNP.  Other Orders: T-BNP  (B Natriuretic Peptide) ([id number])  Patient Instructions: 1)  Your physician recommends that you schedule a follow-up appointment in: ONE YEAR

## 2011-04-05 ENCOUNTER — Other Ambulatory Visit: Payer: Self-pay | Admitting: Cardiology

## 2011-04-27 NOTE — Consult Note (Signed)
[name], [name]                ACCOUNT NO.:  [id number]   MEDICAL RECORD NO.:  [id number]          PATIENT TYPE:  INP   LOCATION:  3702                         FACILITY:  [facility]   PHYSICIAN:  Wesley Frieze. Jens Som, MD, FACCDATE OF BIRTH:  Jul 10, 1946   DATE OF CONSULTATION:  08/27/2008  DATE OF DISCHARGE:                                 CONSULTATION   PRIMARY CARDIOLOGIST:  Wesley Frieze. Jens Som, MD, [facility]   PRIMARY CARE PHYSICIAN:  Wesley Guard, MD in [place].   REQUESTING PHYSICIAN:  Wesley Lavera Guise, MD of [facility].   REASON FOR CONSULTATION:  Chest pain.   HISTORY OF PRESENT ILLNESS:  A 65 year old Caucasian male with  complaints of lower extremity edema over the last 4 days who was seen by  his primary care physician, [name] and was found to have positive  DVT per Dopplers in his office.  The patient has cardiovascular risk  factors to include hypertension, hypercholesterolemia, and tobacco and  has been having exertional chest pain x1 year.  He describes this as a  dull pain in his upper chest with walking, also after carrying something  heavy or eating with some associated nausea, and states the pain lasts 5  minutes, occurs with occasional shortness of breath, and goes away with  rest.  He noticed the symptoms of increased shortness of breath after  starting his Crohn medication, as he had been diagnosed within the last  2 years.  He states that the pain sometimes radiates to his shoulders  and, it has just been ongoing with no change in intensity.  His DVT  symptoms, however, has only been over the last 3-4 days.  He has not had  any lower extremity pain.  He just noticed some lower extremity edema on  the left and had it checked out by his physician thinking it might have  been a mild infection.   REVIEW OF SYSTEMS:  Positive for exertional chest pain, dyspnea on  exertion, lower extremity edema, and low-grade nausea associated.   PAST MEDICAL HISTORY:  1. Crohn  disease, diagnosed 3 years ago.  2. GI bleeding secondary to Crohn disease.  3. Hypertension.  4. GERD.  5. CVA, 15 years ago.   PAST SURGICAL HISTORY:  Bilateral knee replacement in November 2008 and  in June 2008.   SOCIAL HISTORY:  He lives in [place] with his wife.  He is an  Probation officer.  He is a 50-pack-year smoker but chews for the last 3  years.  Negative for EtOH or drug use.  He is not very active.   FAMILY HISTORY:  Mother with protein S deficiency and deceased with a  CVA.  Father deceased with a brain tumor.  Brothers and sisters without  history of CAD.   CURRENT MEDICATIONS:  1. Coumadin.  2. Senokot.  3. Vasotec 40 mg once a day.  4. Sulfasalazine 2000 mg a day.  5. Atenolol 100 mg daily.  6. Folic acid 1 mg daily.  7. The patient is on a heparin drip, bridged to Coumadin.  8. Zantac 150 mg a day.  9. Zocor 40 mg a day.   ALLERGIES:  No known drug allergies.   CURRENT LABORATORY DATA:  Troponin 0.01, CK 96, and MB 1.9.  Sodium 141,  potassium 4.2, chloride 104, CO2 of 31, BUN 9, creatinine 0.62, and  glucose 104.  Hemoglobin 12.5, hematocrit 37.4, white blood cells 7.7,  and platelets 264.  PT 17.9 and INR 1.4.  Total cholesterol 241,  triglycerides 196, HDL 42, and LDL 160.   CARDIOVASCULAR RISK FACTORS:  Hypertension, hypercholesterolemia, and  tobacco abuse.   PHYSICAL EXAMINATION:  VITAL SIGNS:  Blood pressure 138/80, pulse 70,  respirations 18, temperature 97.8, and O2 sat 95% on room air.  HEENT:  Head is normocephalic and atraumatic.  Eyes, PERRLA.  Mucous  membranes in mouth pink and moist.  Tongue is midline.  NECK:  Supple.  There is no JVD or carotid bruits appreciated.  CARDIOVASCULAR:  Regular rate and rhythm with a soft S1 murmur  auscultated.  Pulses are 2+ and equal without bruits.  LUNGS:  Clear to auscultation.  ABDOMEN:  Soft, nontender with 2+ bowel sounds.  Hernia is noted.  EXTREMITIES:  Without clubbing, cyanosis, or edema.   NEUROLOGIC:  Cranial nerves II through XII are grossly intact.   Chest x-ray, CT of the abdomen reveals a 12-mm __________ lesion from  the left kidney.  Renal ultrasound revealed a 2-cm lesion in the lower  pole of the left kidney.  Small cell carcinoma could not be excluded.  EKG revealing sinus bradycardia with ventricular rate of 59 beats per  minute.   IMPRESSION:  1. Left lower extremity deep vein thrombosis.  2. Chest pain, classic for angina.  3. Hypertension.  4. History of cerebrovascular accident (15 years ago).  5. Hypercholesterolemia, newly diagnosed.  6. Tobacco abuse.   PLAN:  This is a 65 year old Caucasian male who was admitted with  diagnosis of left lower leg DVT and complaints of chest pain with  exertion x1 year with multiple cardiovascular risk factors.  He was  admitted on August 23, 2008, secondary to positive ultrasound  revealed DVT in physician's office.  The patient has a strong family  history of protein S deficiency.  He also complains of chest pain with  exertion relieved with rest x1 year with increasing severity, none at  rest.  The patient has limited his activities due to the above.  The  patient has also been diagnosed with left renal mass, questionable  malignancy.   This is a very complicated case, the patient with classic angina, now  limiting activities.  He may also have renal malignancy, questionable  need for nephrectomy, eventually would need preop clearance.  However,  he also has new DVT and also with Crohn disease, any PCI would require  at least a short-term aspirin and Plavix in addition to Coumadin for  DVT, which would exacerbate hematochezia.   I have discussed the issues with the patient including risks and  benefits of the cardiac catheterization.  He will receive his cardiac  catheterization in the a.m. to define anatomy.  This has been discussed  with Dr. Charlies Constable.  We will hold his Coumadin for now but continue   heparin.  We will add enteric-coated aspirin 81 mg daily.  Further  recommendations will be based on catheterization results.  We will  resume Coumadin after coronaries were addressed.   No evaluation of renal mass planned but may need neurology consult at  your discretion.  We would ask Hematology  to see to evaluate for  possible hypercoagulable state workup with questionable DVT secondary to  this and protein S deficiency, also secondary to renal malignancy.      Bettey Mare. Lyman Bishop, NP      Wesley Frieze. Jens Som, MD, [facility]  Electronically Signed    KML/MEDQ  D:  08/27/2008  T:  08/28/2008  Job:  [id number]   cc:   [name], M.D.

## 2011-04-27 NOTE — H&P (Signed)
[name], [name]                ACCOUNT NO.:  [id number]   MEDICAL RECORD NO.:  [id number]          PATIENT TYPE:  OBV   LOCATION:  1413                         FACILITY:  [facility]   PHYSICIAN:  Bertram Millard. Dahlstedt, M.D.DATE OF BIRTH:  05/15/1946   DATE OF ADMISSION:  11/26/2008  DATE OF DISCHARGE:                              HISTORY & PHYSICAL   REASON FOR ADMISSION:  Abdominal discomfort, constipation, status post  left partial nephrectomy,   BRIEF HISTORY:  [name] is a 65 year old male who is about a week out  from an attempted laparoscopic cryoablation.  He had a ureteral injury  identified during the procedure, and this was converted to an open  ureteral repair and partial nephrectomy.  He stayed 3 days  postoperatively.  He left over the preceding weekend.  He presented in  my office yesterday afternoon with abdominal discomfort, distention and  decreased stool production.  He was fairly uncomfortable.  Due to the  patient's status, I felt it best to admit him for hydration and pain  management.  There is no history of fever.  He does not have gross  hematuria.  He has not had significant nausea or vomiting.  He has been  passing a small amount of stool and occasional flatus.   PAST MEDICAL HISTORY:  His medical history is significant for recent  total knee arthroplasty.  He had a DVT postop and a pulmonary embolus.  Evaluation revealed the patient had coronary artery disease and he  underwent a coronary artery bypass graft.  He has been on Lovenox and  Coumadin.  His a history of hypertension, GERD and Crohn's disease.   MEDICATIONS:  Include aspirin, atenolol, Lovenox, Coumadin 5 mg a day,  Diltiazem, enalapril, folic acid, Naprosyn, ranitidine and  sulfasalazine.  There are no known drug allergies.   The patient is married and has children.  He denies smoking, but does  chew tobacco.  He denies drinking.   REVIEW OF SYSTEMS:  Is significant for abdominal discomfort  and  distention.  He has no chest pain.  He has no significant peripheral  edema.   EXAM:  In my office revealed the patient to be slightly uncomfortable.  Breathing was normal.  LUNGS:  Were clear bilaterally.  HEART:  Normal rate and rhythm.  ABDOMEN:  Was protuberant, soft, nondistended with minimal generalized  tenderness.  Incisions were all healing well without evidence of redness  or drainage.   LABORATORIES ON ADMISSION:  INR was 2.6, pro time 29.9.  Basic metabolic  profile was normal except for a bicarb of 34.  Creatinine was normal at  0.98.  CBC revealed a normal white count, hematocrit of 33% and normal  platelet count.   In the office he was found to have pyuria.  Urine was cultured in the  office.  Abdominal series revealed mild picture of an ileus.  No evidence of  obstruction.  Chest reveals previous CABG, lungs clear.   IMPRESSION:  1. Status post left partial nephrectomy with ureteral injury and      repair.  Final pathology revealed  a cystic change,      no evidence of neoplasm.  2. Possible ileus.  We will rule out urinary leak with eventual      ultrasound or CT.  3. Admit for intravenous hydration and pain management.      Bertram Millard. Dahlstedt, M.D.  Electronically Signed     SMD/MEDQ  D:  11/27/2008  T:  11/27/2008  Job:  [id number]

## 2011-04-27 NOTE — Assessment & Plan Note (Signed)
[facility]                            CARDIOLOGY OFFICE NOTE   JAYMERE, ALEN                       MRN:          [id number]  DATE:01/01/2009                            DOB:          1946/07/10    [name] is a pleasant gentleman who has a history of coronary artery  disease.  The patient underwent coronary bypass and graft in September  2009.  During that admission, he had been found to have a DVT and was  placed on Coumadin as well.  He also was found to have renal mass.  His  postoperative course was complicated by atrial fibrillation and he was  placed on amiodarone.  Since I last saw him on September 25, 2008, the  patient has undergone surgical procedure for his kidney mass and was  found to be benign by his report.  He is slowly recovering from that  surgery.  However, since I saw him on September 25, 2008, he does state  that his pedal edema has improved.  He does have dyspnea on exertion,  but this is a chronic issue and unchanged since September 25, 2008.  He  attributes this to COPD.  Note, there is no orthopnea or PND.  There is  no associated chest pain, productive cough, fevers, or chills.  It  resolves immediately with rest.   CURRENT MEDICATIONS:  1. Sulfasalazine.  2. Folate.  3. Lopressor 100 mg p.o. b.i.d.  4. Zantac 150 mg p.o. b.i.d.  5. Aspirin 81 mg p.o. daily.  6. Coumadin as directed.  7. Pacerone 200 mg p.o. daily.  8. Pravachol 80 mg p.o. daily.  9. Lasix 40 mg p.o. daily.  10.Potassium 10 mEq p.o. daily.   PHYSICAL EXAMINATION:  VITAL SIGNS:  Blood pressure 150/100 and his  pulse is 73.  He weighs 235 pounds.  HEENT:  Normal.  NECK:  Supple.  CHEST:  Clear.  CARDIOVASCULAR:  Regular rhythm.  His sternotomy is without evidence of  infection.  ABDOMEN:  He is status post abdominal surgery.  EXTREMITIES:  Show trace edema.   His electrocardiogram shows a sinus rhythm at a rate of 74.  There are  no ST changes  noted.   DIAGNOSES:  1. Coronary artery disease status post coronary bypass and graft - Mr.      Kindred continues to do well from a symptomatic standpoint.  He will      continue on his aspirin, beta-blocker, and statin.  He has asked      about returning to work.  From a cardiac standpoint, I think this      would be fine.  I have asked him to contact urologist concerning      his recent abdominal surgery and also the CVTS surgeons concerning      the stability of his sternum.  If they have no objection, then I      think he can return to work without restriction.  I have asked him      not to lift heavy objects for 4-6 weeks due to his previous  sternotomy.  2. Postoperative atrial fibrillation - the patient remains in sinus      rhythm today.  I think this is most likely related to his coronary      artery bypass and graft.  I will discontinue his Pacerone.  3. Dyspnea - this is most likely related to chronic obstructive      pulmonary disease.  I will check a BNP to make sure that he is not      volume overloaded.  However, the addition of Lasix has helped      significantly this issue.  4. Recent deep venous thrombosis - the patient will continue on      Coumadin with goal INR of 2-3 and this is being monitored in [facility].  He was seen by Dr. Myna Hidalgo of Hematology.  In Dr. Gustavo Lah      note, he stated that the Coumadin should continue for 1 year.  We      would then discontinue the Coumadin and check his protein S at that      point (2 months after discontinuing his Coumadin).  He does have a      strong family history of protein S deficiency.  If he indeed has      protein S deficiency, then he will most likely need Coumadin for      life.  5. History of renal mass - he is status post surgery for this and      apparently it was benign.  He will follow up with Dr. Earlene Plater      concerning this issue.  6. Hypertension - his blood pressure is elevated today.  We will  add      lisinopril 10 mg p.o. daily.  I will check a BMET in 1 week to      follow his potassium and renal function.  7. Hyperlipidemia - we will check lipids and liver and adjust with      goal LDL of less than 70.  8. History of Crohn disease.  9. Gastroesophageal reflux disease.   We will see him back in 3 months.     Madolyn Frieze Jens Som, MD, [facility]  Electronically Signed    BSC/MedQ  DD: 01/01/2009  DT: 01/01/2009  Job #: [id number]

## 2011-04-27 NOTE — Consult Note (Signed)
NAMESTEFFAN, Wesley NO.:  [id number]   MEDICAL RECORD NO.:  [id number]          PATIENT TYPE:  INP   LOCATION:  6532                         FACILITY:  [facility]   PHYSICIAN:  Kerin Perna, M.D.  DATE OF BIRTH:  1946-05-30   DATE OF CONSULTATION:  08/28/2008  DATE OF DISCHARGE:                                 CONSULTATION   PHYSICIAN REQUESTING CONSULTATION:  Madolyn Frieze. Jens Som, MD, [facility]   PRIMARY CARE PHYSICIAN:  Windle Guard, MD   REASON FOR CONSULTATION:  Severe left main and three-vessel coronary  artery disease with class III angina.   CHIEF COMPLAINT:  Exertional chest pain, progressive over the past  several weeks.   HISTORY OF PRESENT ILLNESS:  I was asked to evaluate this 65 year old  obese ex-smoker for potential surgical coronary revascularization  following recent diagnosis of severe left main and three-vessel coronary  artery disease.  The patient was admitted to hospital with a diagnosis  of left leg DVT when he developed swelling and some tenderness and the  DVT was documented by noninvasive ultrasound.  He was placed on heparin,  Coumadin, and had a Cardiology consul to evaluate his chest pain.  Cardiac catheterization was recommended, which performed today.  This  demonstrated a 75% stenosis of the left main, 50% stenosis of the mid  LAD, and 90% ostial stenosis of the right coronary.  His LVEF was 60%  and LVEDP was 19 mmHg.  There is no evidence of aortic stenosis or  mitral regurgitation on the cath and surgical revascularization was  recommended.  The patient currently is stable on his IV heparin, which  was started 4 days ago for the DVT.   PAST MEDICAL HISTORY:  1. 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  2. DVT, left leg.  3. History of Crohn disease followed by gastroenterologist in [facility].  4. Family history of hypercoagulable state (protein S deficiency).  5. Status post  bilateral knee replacements.  6. Hypertension.  7. GERD.  8. History of CVA 15 years ago.   HOME MEDICATIONS:  1. Aspirin.  2. Atenolol 100 mg daily.  3. Cardizem CD.  4. Vasotec 40 mg a day.  5. Naprosyn 500 mg at bedtime.  6. Zantac 150 b.i.d.  7. Sulfasalazine 500 mg tablets 5 tablets each morning and 4 tablets      at bedtime.   ALLERGIES:  None.   SOCIAL HISTORY:  He stopped smoking 3 years ago but still chews tobacco.  He is married with adult children.  He is retired.   FAMILY HISTORY:  Protein S deficiency was noted on his mother's side of  the family.  His mother died of a stroke and his father died of brain  cancer.   REVIEW OF SYSTEMS:  No recent change in weight or fever.  He does have a  chronic productive cough.  No active dental problems or difficulty  swallowing.  He has had no significant history of thoracic trauma.  A  recent chest x-ray has not been yet performed.  GI review is negative  for hepatitis or jaundice, but he did have some intermittent rectal  bleeding from the Crohn's.  He has had no surgery by history for his  Crohn disease.  He denies gross hematuria or flank pain.  He has no  history of prior episodes of DVT or varicose veins.  There is no history  of diabetes.   PHYSICAL EXAMINATION:  The patient is 5 feet 6, weighs 240 pounds, blood  pressure is 150/80, and heart rate is 76 and regular.  HEENT exam is  normocephalic.  Dentition adequate.  Neck is without mass, bruit, or  JVD.  Lymphatics show no palpable supraclavicular or cervical  adenopathy.  Breath sounds are diminished with scattered wheeze.  Cardiac exam is regular rhythm.  No S3, gallop, or murmur.  Abdominal  exam is obese and soft.  Extremities reveal mild swelling of the left  lower leg with positive pulses bilaterally.  He has bilateral knee  replacement and surgical incisions.  His neurologic exam is nonfocal.   IMPRESSION AND PLAN:  The patient has severe critical  three-vessel  coronary disease, best treated with surgery.   RECOMMENDATIONS:  Finishing at least 1 week of IV heparin for the DVT  then proceeding with surgical revascularization at this admission.  The  left renal mass should be addressed after he recovers from surgery with  either another imaging study or possibly a biopsy.  Prior to cardiac  surgery, I will order a CT angiogram of the chest to rule out any  pulmonary disease related to the renal mass.  Surgery will be scheduled  next week.  I will follow the patient with you.  Thank you for the  consultation.      Kerin Perna, M.D.  Electronically Signed     PV/MEDQ  D:  08/28/2008  T:  08/29/2008  Job:  [id number]

## 2011-04-27 NOTE — H&P (Signed)
[name], [name] NO.:  [id number]   MEDICAL RECORD NO.:  [id number]          PATIENT TYPE:  INP   LOCATION:  1828                         FACILITY:  [facility]   PHYSICIAN:  Vania Rea, M.D. DATE OF BIRTH:  [birth year]   DATE OF ADMISSION:  08/23/2008  DATE OF DISCHARGE:                              HISTORY & PHYSICAL   PRIMARY CARE PHYSICIAN:  Windle Guard, M.D.   CHIEF COMPLAINT:  Left lower extremity deep venous thrombosis.   HISTORY OF PRESENT ILLNESS:  This is a 65 year old Caucasian gentleman  who works as an Probation officer and who reports that he does have a family  history of protein-S deficiency, but there is no previous personal  history of venous thromboembolism, whose wife noted that he has been  having swelling of his left lower extremity for the past 3 days.  The  patient says he himself did not notice the swelling and he has not been  having any pain in the leg nor noticed anything unusual.  However, he  went to the doctor on the prompting of his wife and had an ultrasound  done which revealed extensive DVT from the distal calf to the mid thigh  of the left lower extremity.  The patient was sent to the emergency room  for admission.  The patient denies any chest pain, shortness of breath,  or any dizziness.   PAST MEDICAL HISTORY:  1. Crohn's disease and had his sulfasalazine increased on recent visit      to the doctor.  He does have episodic rectal bleeding from his      Crohn's.  2. Hypertension.  3. GERD.   MEDICATIONS:  1. Aspirin 325 mg daily.  2. Atenolol 100 mg daily.  3. Diltiazem unsure of the dose, he thinks it is 60 mg daily.  4. Enalapril 40 mg daily.  5. Folic acid 800 mcg daily.  6. Naproxen 500 mg at bedtime.  7. Ranitidine 150 mg twice daily.  8. Sulfasalazine 500 mg tablets 5 tablets each morning and 4 tablets      at bedtime.   ALLERGIES:  No known drug allergies.   SOCIAL HISTORY:  He chews tobacco.  He denies  smoking tobacco.  He  denies alcohol or illicit drug use.   FAMILY HISTORY:  Significant for a protein-S deficiency on his mother's  side of the family, although he denies that there is a family history of  DVTs.  His father died of a brain tumor and his mother died of a stroke.   REVIEW OF SYSTEMS:  Other than noted above, a 10-point review of systems  is unremarkable.  He has not had any rectal bleeding for a week.   PHYSICAL EXAMINATION:  GENERAL:  A pleasant, obese, middle-age,  Caucasian gentleman lying on the stretcher in no acute distress.  VITAL SIGNS:  Temperature is 98.1, pulse 63, respirations 18, blood  pressure 163/85, he is saturating at 97% on room air.  He is in no pain.  HEENT:  His pupils are round and equal.  Mucous membranes are pink and  anicteric.  He is not dehydrated.  No cervical lymphadenopathy,  thyromegaly, or carotid bruits.  CHEST:  Clear to auscultation bilaterally.  CARDIOVASCULAR:  Regular rhythm without murmur.  ABDOMEN:  Obese, soft, and nontender.  EXTREMITIES:  He has 2+ edema of the left lower extremity.  The leg is  erythematous and warm.  CENTRAL NERVOUS SYSTEM:  Cranial nerves II-XII grossly intact.  There is  no focal neurologic deficit.   LABORATORY DATA:  White count 6.9, hemoglobin 12.2, MCV 95.2, platelets  206.  He has a normal differential.  His INR is 1.0.  His PTT is 26.  His serum chemistry is unremarkable with a creatinine of 0.9, glucose of  93.   ASSESSMENT:  1. Acute left lower extremity deep venous thrombosis.  2. Hypertension, uncontrolled.  3. Crohn's disease.   PLAN:  We will continue this gentleman on his treatment for Crohn's.  We  will start him on treatment for the DVT with a heparin infusion in case  he has worsening GI bleed, this can be rapidly discontinued and we will  have to opt for a filter.  The heparin has already been started and we  will see if we can get thrombophilia workup on his pre-heparin labs.   His blood pressure is not adequately controlled at this time.  We will  monitor on his home medications, but may need to make further  adjustments.  For other plans, please see orders.      Vania Rea, M.D.  Electronically Signed     LC/MEDQ  D:  08/24/2008  T:  08/24/2008  Job:  [id number]   cc:   Windle Guard, M.D.

## 2011-04-27 NOTE — Op Note (Signed)
NAMEDANIELA, HERNAN                ACCOUNT NO.:  [id number]   MEDICAL RECORD NO.:  [id number]          PATIENT TYPE:  INP   LOCATION:  1428                         FACILITY:  [facility]   PHYSICIAN:  Bertram Millard. Dahlstedt, M.D.DATE OF BIRTH:  02-Sep-1946   DATE OF PROCEDURE:  11/20/2008  DATE OF DISCHARGE:                               OPERATIVE REPORT   PREOPERATIVE DIAGNOSIS:  Twelve mm left renal lesion.   POSTOPERATIVE DIAGNOSIS:  Twelve mm left renal lesion.   PROCEDURE:  Laparoscopic left renal dissection, conversion to open left  renal exploration/ureteral repair/partial nephrectomy.   SURGEON:  Bertram Millard. Dahlstedt, M.D.   FIRST ASSISTANT:  Dr. Allena Katz.   ANESTHESIA:  General endotracheal.   ESTIMATED BLOOD LOSS:  200 mL.   COMPLICATIONS:  Left ureteral injury.   BRIEF HISTORY:  This is a 65 year old male who had the diagnosis of a  left renal mass, exophytic, 12 mm in size on a CT scan.  He had a DVT  after knee replacement.  He has had recent coronary artery bypass.  CT  scan was performed during a recent hospitalization.  This showed a  partially enhancing exophytic lesion on the posterior aspect of the left  kidney.  Ultrasound revealed possible cystic component to this, but also  solid component.  The patient was originally seen by Dr. Gaynelle Arabian.  He was sent for possible cryoablation.  I discussed treatment of this  with the patient - followup versus cryoablation versus laparoscopic  excision.  He would like to proceed with the cryoablation.  Risks and  complications have been discussed with the patient.  These include but  are not limited to infection, bleeding, need for removal of the entire  kidney, injury to surrounding organs, repeat DVT, among others.  They  understand these and desire to proceed.   DESCRIPTION OF PROCEDURE:  The patient was identified in the holding  area and the surgical side marked.  He was then brought to the operating  room.  General  anesthetic was administered.  The bladder was  catheterized.  He was then placed in the left side up position on the  table.  Axillary roll was placed, all extremities were padded  appropriately.  The table was flexed and the beanbag used to cradle the  patient.  He was fixed to the table.  His anterior abdominal wall and  left flank were prepped and draped.  A 12 mm incision was made  approximately 3 cm lateral to the umbilicus, which was involved with an  umbilical hernia.  Dissection was carried down to the fascia and the  peritoneal space was gained by using the OptiView trocar.  Pneumoperitoneum was established.  Two more trocars were placed, both 5  mm ports.  One of these was placed further to the left in the left lower  quadrant and one just off the midline in the upper abdomen on the left.  These were placed under direct vision.  Dissection was then carried out  of the left colon.  It was reflected medially.  There was significant  fat associated  with the patient's retroperitoneum, which made dissection  somewhat difficult.  We proceeded to identify the anterior aspect of  Gerota's fascia once the colon had been adequately mobilized.  Lateral  dissection was then performed, mobilizing the lateral aspect of Gerota's  fascia.  Dissection was then carried inferiorly.  On the medial aspect  of the inferior dissection, the dissection carried [place] down to psoas  fascia.  There was a significant amount of fat in this area - dissection  was carried superiorly, which took [place] to the lower pole of the kidney.  The fat was dissected over top of the lower pole.  It took some time to  get established in the right plane down to the capsule of the lower  pole.  Once the proper plane was established, further dissection carried  [place] anteriorly and laterally as well as posteriorly on the kidney.  It  was quite difficult to see this exophytic lesion.  Dissection was  carried up approximately 4 cm on the  posterior aspect of the kidney  without seeing the lesion.  During this dissection it was evident that  the ureter had been divided, approximately 1 cm superior to the lower  pole of the kidney.  It was evident that a structure which had been  divided was peristalsing.  I did not see the distal end of this, just  the proximal stump.  I felt that repair was in order, and that it would  be most advantageous to perform this on an open approach.  Pneumoperitoneum was released and a subcostal incision was then  performed approximately 6 cm long.  Dissection carried [place] down into the  peritoneum.  The lower pole of the kidney was easily identified.  There  was no significant bleeding at this point.  The stump proximally and  distally were identified.  They were trimmed and spatulated.  There was  bleeding of the proximal stump and the distal stump, allowing me to know  that there was adequate perfusion and there was no tissue injury from  the LigaSure.  At this point stay sutures were placed in the ureter.  We  then found the mass which was exophytic on the posterior lateral aspect  of the kidney, approximately 2 cm from our superior dissection.  This  seemed to be somewhat cystic but had a firm base.  Without clamping the  artery we decided to resect this.  It was not very deep.  Approximately  3-4 mm of parenchyma were taken underneath, as well as a 5 mm lateral  margin.  The cautery was used to provide hemostasis at the base of the  tumor.  FloSeal and a Surgicel pledget were placed, and sutured into  place using a 2-0 Vicryl.  A 6 French 24 cm Contour stent was placed  through the ureter proximally and distally.  The anastomosis was then  performed using an interrupted suture of 4-0 PDS.  There was a tension  free anastomosis at this point.  We did perform a nephropexy using a  zero Vicryl placed on the psoas muscle and brought through the capsule  and some fat on the kidney.   Inspection  of the dissection sites revealed adequate hemostasis.  A  drain was placed in the retroperitoneum near the renal and ureteral  dissection.  It was brought through the left lower quadrant trocar site.  It was sutured to the skin with 2-0 nylon.  Anatomic closure of the  abdominal wall was  performed with running #1 PDS.  Skin staples were  placed.  Sponge, needle and instrument counts were correct.  The patient  was awakened and then taken to the PACU in stable condition.      Bertram Millard. Dahlstedt, M.D.  Electronically Signed     SMD/MEDQ  D:  11/22/2008  T:  11/23/2008  Job:  [id number]

## 2011-04-27 NOTE — Consult Note (Signed)
[name], [name] NO.:  [id number]   MEDICAL RECORD NO.:  [id number]          PATIENT TYPE:  INP   LOCATION:                               FACILITY:  [facility]   PHYSICIAN:  Drue Second, MD       DATE OF BIRTH:  [birth year]   DATE OF CONSULTATION:  08/28/2008  DATE OF DISCHARGE:                                 CONSULTATION   DATE OF CONSULTATION:  August 28, 2008.   REASON FOR CONSULTATION:  DVT - renal lesion.   REFERRING PHYSICIAN:  [facility].   HISTORY OF PRESENT ILLNESS:  [name] is a pleasant 65 year old man  asked to see for evaluation of DVT and renal lesion.  He was found to  have DVT on the left lower extremity per Dopplers as an outpatient, as  he had presented to his primary care physician with bilateral lower  extremity edema.  He was admitted to [facility] for further  workup.  He was placed on heparin on August 24, 2008, as well as on  Coumadin, which eventually was discontinued as of August 27, 2008.  In the intent in, he had a cardiac catheterization, with severe three-  vessel CAD.  CVTS is discussing at this time, the patient situation, to  determine if he is a candidate for CABG.  Moreover, a CT of the abdomen  had been performed upon admission, revealing a 2 cm left renal mass in  the lower pole of the left kidney, suspicious for renal cell carcinoma.  No biopsy was performed during this admission.  There are no other films  at this time.  CT of the pelvis with contrast shows no acute findings.  We were asked to see him, with recommendations regarding his care.   PAST MEDICAL HISTORY:  1. Hypertension.  2. Hypercholesterolemia.  3. Tobacco habituation.  4. Crohn's disease diagnosed in 2006.  5. GERD.  6. 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   SURGERIES - PROCEDURES:  1. Status post bilateral knee replacement November of 2008, June of      2008.  2. Status post cardiac catheterization August 27, 2008.   ALLERGIES:  NKDA.   MEDICATIONS:  Aspirin, atenolol, Vasotec, folic acid, heparin per  pharmacy, Senokot, Zocor, sulfasalazine, Tylenol, Dulcolax, Maalox.   REVIEW OF SYSTEMS:  Remarkable for GI symptoms compatible with Crohn's  disease, dyspnea on exertion, exertional chest pain on admission, now  resolved.  Mild nausea.  Peripheral edema, 4 days prior to admission,  currently improving.  He also has fatigue.  Rest of the 14 point of  review of systems is negative.   FAMILY HISTORY:  Mother died with CVA.  She also had a history of  protein S deficiency.  Father died with brain cancer.  One sister alive  with CAD, one brother alive with CAD.   SOCIAL HISTORY:  The patient is married.  He has one daughter  accompanying him.  He smoked one pack a day of cigarettes for at least 0  years, he then chewed tobacco for the last 3 years.  No alcohol history.   PHYSICAL EXAM:  This is a moderately obese 65 year old white male in no  acute distress, alert and oriented x3.  Blood pressure 128/55, pulse 71,  respirations 16, temperature 98.7, pulse oximetry 92% in room air.  Weight 105 kilograms.  HEENT:  Normocephalic, atraumatic.  Sclerae anicteric.  Oral cavity  without thrush or lesions.  NECK:  Supple.  No cervical or supraclavicular masses.  LUNGS:  Remarkable for atelectatic sounds bilaterally, no rhonchi or  rales.  No axillary masses.  CARDIOVASCULAR:  Regular rate and rhythm without murmurs, rubs or  gallops.  ABDOMEN:  Soft, non nontender.  Bowel sounds x4.  No hepatosplenomegaly.  The abdomen is obese.  EXTREMITIES:  With no clubbing or cyanosis.  Trace edema in the right  lower extremity.  In the left, the swelling has decreased, per patient  report.  SKIN:  Without bruising or petechial rash.  GU/RECTAL:  Deferred.  MUSCULOSKELETAL:  With no spinal tenderness.  NEURO:  Nonfocal.   LABORATORIES:  Hemoglobin 12.4, hematocrit 36.6, white count 9.4,  platelets 297, MCV 93.5,  ANC 6.6, monocytes 0.6, lymphocytes 1.7, folic  acid greater than 20.  B12 is 515, iron 46, TIBC 368, percentage  saturation 13.  PTT 26.  PT 19.8, INR 1.6, sed rate 6.  Sodium 141,  potassium 4.2, BUN 9, creatinine 0.62, glucose 104, total bilirubin 0.5,  alkaline phosphatase 74, AST 17, ALT 9, total protein 6.3, albumin 3.1,  calcium 8.6, troponin negative.  Antiphospholipid syndrome evaluation  was cancelled at this time.   ASSESSMENT/PLAN:  Dr. Welton Flakes has seen and evaluated the patient and  reviewed the chart.  This is a 65 year old man with a very complicated  medical history.  He was admitted with deep vein thrombosis of the left  lower extremity in the setting of family history of protein S  deficiency.  As part of the workup for DVT, a CT scan was obtained and  scan found to have an incidental 2 centimeter left renal mass,  exophytic, with question of renal malignancy.  He also has had exertion  and chest pain.  Cardiac catheterization now reveals severe left main  disease with severe ostial RCA stenosis.  The patient may be undergoing  coronary artery bypass graft, CVTS was consulted by Cardiology.  We were  asked to see the patient regarding deep vein thrombosis and renal mass.   RECOMMENDATIONS:  1. Renal mass:  Urology consult.  The patient not able to get MRI.      Urology input will be invaluable in the situation if they feel that      the mass is malignant.  The possibility of a biopsy versus      nephrectomy needs to be entertained.  2. Deep vein thrombosis - thrombophilia:  Hypercoagulable panel to      look for protein S deficiency will not be reliable since the      patient is on heparin.  There are some tests that can be performed,      including factor V Leiden, prothrombin gene mutation.  If they have      not been performed already, then we will follow up on the results.  3. The patient should remain on IV heparin pending his cardiac      management.  IV heparin  will be easy to reverse      and manage in this patient for now.  4. We will follow with you.   Thank  you very much for allowing [place] the opportunity to participate in  the care of this nice patient.      Marlowe Kays, P.A.      Drue Second, MD  Electronically Signed    SW/MEDQ  D:  08/29/2008  T:  08/29/2008  Job:  [id number]   cc:   Windle Guard, M.D.

## 2011-04-27 NOTE — Assessment & Plan Note (Signed)
[facility]                            CARDIOLOGY OFFICE NOTE   JAKSON, DELPILAR                       MRN:          [id number]  DATE:09/25/2008                            DOB:          1946-09-23    [name] is a very pleasant 65 year old gentleman who was recently  admitted to [facility] with an extensive DVT in the left lower  extremity.  Note, he does have strong family history of protein S  deficiency.  He during that admission related history of exertional  chest pain and Cardiology was asked to evaluate.  He ultimately  underwent cardiac catheterization, which showed left main disease.  He  also underwent coronary bypass and graft with a LIMA to the LAD,  saphenous vein graft to the diagonal, saphenous vein graft to the obtuse  marginal, and saphenous vein graft to the PDA.  Also CT scan of the  abdomen prior to surgery showed an exophytic lesion of the left kidney.  Urology was consulted and felt that this needed to be worked up after  his cardiac status was addressed.  Postoperatively, the patient did have  atrial fibrillation and was placed on amiodarone.  He was also resumed  on his Coumadin for his atrial fibrillation as well as his DVT.  He also  had some problems with volume excess postoperatively and was treated  with diuretics.  He was discharged home and after he completed short  course of diuretics, he began to have weight gain, dyspnea, and  swelling.  He was therefore resumed on Lasix for a short period of time.  He had laboratories drawn on September 23, 2008.  His BNP was elevated at  259, but his BUN/creatinine was 20 and 0.77.  He also had a chest x-ray  on September 23, 2008, that showed resolution of small effusions and  stable cardiomegaly.  The patient does have dyspnea on exertion and he  is gaining weight again off of diuretics.  However, he has not had a  chest pain.  He denies any orthopnea or PND, but there is  mild pedal  edema.  He has not had palpitations or syncope.   His medications at present include sulfasalazine 500 mg tablets 5 in the  morning and 4 in the evening, folate, Pacerone 200 mg p.o. b.i.d.,  metoprolol 100 mg p.o. b.i.d., Zantac 150 mg p.o. b.i.d., aspirin 81 mg  p.o. daily, and Coumadin as directed, which is being followed at  [facility] in [place].   PHYSICAL EXAMINATION:  VITAL SIGNS:  Blood pressure of 147/95 and his  pulse is 61.  He weighs 229 pounds.  HEENT:  Normal.  NECK:  Supple.  CHEST:  Clear.  CARDIOVASCULAR:  Regular rate and rhythm.  His sternotomy is without  evidence of infection.  ABDOMEN:  Shows no tenderness.  EXTREMITIES:  Show trace edema.   His electrocardiogram shows a sinus rhythm at a rate of 61.  The axis is  normal.  There are no significant ST changes.   DIAGNOSES:  1. Coronary artery disease status post coronary  bypass and graft - Mr.      Turnbaugh is doing well with no chest pain.  He will continue with his      aspirin and his beta-blocker.  I will also add Pravachol 80 mg p.o.      daily and we will check lipids and liver in 6 weeks and adjust as      indicated.  2. Postoperative atrial fibrillation - He will continue on his      Pacerone and Coumadin for now.  I have asked him to decrease his      Pacerone to 200 mg p.o. daily.  I will see him back in 6 weeks and      if he remains in sinus then we will discontinue his Pacerone at      that point.  3. Volume excess - He appears to be volume overloaded today.  We will      resume his Lasix at 40 mg p.o. daily as well as his potassium at 20      mEq p.o. daily.  We will check a BMET in 1 week to follow his      potassium and renal function.  4. Recent deep venous thrombosis - He will continue on his Coumadin      with goal INR of 2-3.  This is being monitored in [facility].  He      will need to see Hematology for further evaluation of possible      protein S deficiency.  He has a  strong family history of this.  If      he indeed has protein S deficiency and now a documented deep venous      thrombosis, then he would need Coumadin for life.  5. History of renal mass - He will need to follow up with Dr. Earlene Plater of      Urology for further evaluation in the future.  He may need      nephrectomy.  6. Hypertension - His blood pressure is elevated today.  We are adding      back a diuretic.  We will track this and resume an angiotensin-      converting enzyme inhibitor if needed.  7. Hyperlipidemia - We are adding the statin as outlined above.  8. History of Crohn disease.  9. Gastroesophageal reflux disease.   I will see him back in 4-6 weeks in our [facility].     Madolyn Frieze Jens Som, MD, [facility]  Electronically Signed    BSC/MedQ  DD: 09/25/2008  DT: 09/25/2008  Job #: [id number]   cc:   Sheliah Plane, MD  Lucrezia Starch. Earlene Plater, M.D.  Rose Phi. Myna Hidalgo, M.D.  Windle Guard, M.D.

## 2011-04-27 NOTE — Consult Note (Signed)
[name], [name]                ACCOUNT NO.:  [id number]   MEDICAL RECORD NO.:  [id number]          PATIENT TYPE:  INP   LOCATION:  6532                         FACILITY:  [facility]   PHYSICIAN:  Lucrezia Starch. Earlene [name], M.D.  DATE OF BIRTH:  November 03, 1946   DATE OF CONSULTATION:  08/29/2008  DATE OF DISCHARGE:                                 CONSULTATION   HISTORY OF PRESENT ILLNESS:  Mr. Humphres is a very nice 65 year old white  male who presented to [facility] with deep vein thrombosis of  lower extremity.  He had had a knee prosthesis placed fairly recently,  developed swollen leg.  He was found to have a DVT without pulmonary  embolus.  He subsequently had a workup, was found to have significant  coronary artery disease.  He is now scheduled for coronary artery bypass  per Dr. Donata Clay.  On a CT scan, he was found to have approximately 2  cm left renal mass that was midpole posterior and exophytic.  I have  evaluated these x-rays, it appears to be solid, but hyperdense cyst, it  will be difficult to definitively exclude.  He cannot undergo any MRI  due to the prosthetic device and must have resolution of the DVT and  coronary artery disease prior to treatment of the mass.  He has no  hematuria or other renal related symptoms.   PAST MEDICAL HISTORY:  Crohn disease, states is on sulfasalazine,  hypertension and GERD, coronary artery disease, and has had arthritic  changes noted.   MEDICATIONS:  Aspirin, atenolol, diltiazem, enalapril, folic acid,  naproxen, ranitidine, and sulfasalazine.   ALLERGIES:  No known allergies.   SOCIAL HISTORY:  Chews tobacco, negative smoking, and negative drinking.   FAMILY HISTORY:  Significant protein S deficiency on the mother's side  of the family, although he denies that there is family disease of DVT.  Father died of a brain tumor and mother of stroke.   REVIEW OF SYSTEMS:  No shortness of breath, dyspnea on exertion, chest  pain, or GI  complaints.  He does have the leg swelling as noted.  He has  no had blood in his urine or pain and all other review of systems are  negative.   PHYSICAL EXAMINATION:  VITAL SIGNS:  He is afebrile, vital signs stable.  Blood pressure is 146/83, pulse 74, respiration 18, and temperature 98.4  degrees Fahrenheit.  GENERAL:  He is well nourished, well developed, alert and oriented x3.  HEAD, EYES, NOSE, AND THROAT:  Normal.  CHEST:  Clear anteriorly and posteriorly and without rales or rhonchi.  HEART:  Normal S1, S2.  No murmurs, rubs, or gallops.  ABDOMEN:  Soft, nontender without masses or organomegaly.  EXTREMITIES:  He has no lymphedema, although he does have some slight  swelling of the left lower extremity.  He thinks this is better.  It is  really not warm at this time.  NEUROLOGIC:  Intact.  SKIN:  Normal.  GU:  Pubis, meatus, scrotum, testicles, adnexa, and perineum were  normal.   LABORATORY DATA:  BUN  and creatinine is 9/0.62, and x-rays are as noted  above.   IMPRESSION:  Small exophytic left renal mass in this very nice gentleman  who has a fresh deep vein thrombosis and needs coronary bypass.  Plan is  to proceed with coronary artery bypass and treat the deep vein  thrombosis.  We will follow up him at the office when he recovers and we  will consider a needle biopsy of the lesion.  He cannot have an MRI due  to his prosthetic device.  If this is a small renal cell carcinoma or  other cancer as I suspect it will probably be amenable to partial  nephrectomy either robotic versus percutaneous cryosurgical ablation or  laparoscopic cryosurgical ablation of the lesion or percutaneous  radiofrequency ablation with the secondary options.  The patient may  follow up with me after discharge.      [name], M.D.  Electronically Signed     RLD/MEDQ  D:  08/29/2008  T:  08/30/2008  Job:  [id number]   cc:   Kerin Perna, M.D.  [facility], B TEAM

## 2011-04-27 NOTE — Discharge Summary (Signed)
[name], [name] NO.:  [id number]   MEDICAL RECORD NO.:  [id number]          PATIENT TYPE:  INP   LOCATION:  2016                         FACILITY:  [facility]   PHYSICIAN:  Sheliah Plane, MD    DATE OF BIRTH:  1946-04-15   DATE OF ADMISSION:  08/23/2008  DATE OF DISCHARGE:                               DISCHARGE SUMMARY   PRIMARY ADMITTING DIAGNOSIS:  Left lower extremity deep vein thrombosis.   ADDITIONAL/DISCHARGE DIAGNOSES:  1. Left lower extremity deep vein thrombosis.  2. 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  3. Class III angina.  4. 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  5. History of Crohn disease.  6. Hypertension.  7. Gastroesophageal reflux disease.  8. History of cerebrovascular accident 15 years ago.  9. Family history of protein S deficiency.  10.Prior history of tobacco abuse.   PROCEDURES PERFORMED:  1. Cardiac catheterization.  2. Coronary artery bypass grafting x4 (left internal mammary artery to      the LAD, saphenous vein graft to the diagonal, saphenous vein graft      to the obtuse marginal, and saphenous vein graft to the posterior      descending).  3. Endoscopic vein harvest, right leg.   HISTORY:  The patient is a 65 year old male who presented to his primary  care physician, Dr. Jeannetta Nap office with complaints of swelling in his  left lower extremity which had been going on for 2-3 days prior to  admission.  He does have a family history of protein S deficiency and on  ultrasound in the primary care physician's office, he was noted to have  an extensive DVT in the left leg from the distal calf to the mid thigh.  He was subsequently referred to the emergency department at [facility]  for admission.  He was seen by the [facility] hospitalist service and was  admitted for further evaluation and treatment.   HOSPITAL COURSE:  [name] was admitted and started on heparin and  Coumadin.  A CT scan  of the abdomen and pelvis was performed, and no  evidence of pelvic or abdominal masses were noted.  However, there was  an exophytic lesion noted on the left kidney that measured approximately  12 mm.  The patient also described symptoms which were consistent with  angina and based on his past medical history and other risk factors, it  was felt that Cardiology consult should be obtained.  He was seen by Dr.  Olga Millers and it was felt that he should undergo cardiac  catheterization.  This was performed on August 28, 2008, by Dr.  Excell Seltzer and showed severe left main coronary artery disease with  calcified 90% ostial stenosis as well as a severe ostial right coronary  artery stenosis and preserved left ventricular function with an ejection  fraction of 60%.  Because of the severity of his left main stenosis, he  was felt to be of poor candidate for percutaneous intervention and a  cardiac surgery consult was obtained.  The patient was initially seen by  Dr.  Kathlee Nations Trigt and his films were reviewed.  Dr. Donata Clay felt  that his best course of action would be to proceed with coronary artery  bypass grafting after completing 5-7 days with IV heparin.  He explained  all risks, benefits, and alternatives of surgery to the patient and he  agreed to proceed.  Also, Hematology/Oncology was asked to see the  patient regarding his DVT and possible hypercoagulability in addition to  his renal mass.  It was felt that hypercoagulability panel would be  unreliable at this point since the patient had already been  anticoagulated that they could follow him up as an outpatient for  further workup.  Also, it was felt that he could proceed with surgery  and to continue IV heparin until postoperatively.  They also recommended  having the patient seen by Urology for workup of his left renal mass.  Dr. Gaynelle Arabian saw the patient in consultation for Urology and felt  that the left kidney mass could  be followed up as an outpatient as well  and they would consider a needle biopsy of the lesion.  If it is a small  renal cell carcinoma as suspected, it would probably be amenable to  potential nephrectomy.  The patient remained stable during his  preoperative workup and continued on IV heparin.  He underwent carotid  Doppler studies which showed no ICA stenosis and lower extremity  Dopplers which showed normal ABIs bilaterally.  He was taken to the  operating room on September 02, 2008, for coronary artery bypass  grafting which was performed by Dr. Tyrone Sage because of the operative  schedule.  Dr. Tyrone Sage had seen the patient prior to surgery and  reviewed his films as well.  He underwent CABG x4 which is described in  detail above and tolerated the procedure well.  He was transferred to  SICU in stable condition.  He was able to be extubated shortly after  surgery.  He was hemodynamically stable and doing well on postop day #1.  He was restarted on Coumadin at that time as well as beta-blocker and a  statin.  He was kept in the unit for further observation.  By postop day  #2, his chest tubes and hemodynamic monitoring devices had been removed  and he was ready for transfer to floor.  He developed atrial  fibrillation and was treated with IV Lopressor and titration of his dose  of p.o. Lopressor.  He did convert to normal sinus rhythm with frequent  PACs and was monitored closely over the next 24 hours.  He did have a  recurrence of rapid atrial fibrillation and was subsequently started on  IV amiodarone to which he responded quickly.  Since that time, he has  remained in normal sinus rhythm.  He will be switched to p.o. amiodarone  at this time and will continue to be observed closely.  Otherwise, he is  doing well postoperatively.  He has been volume overloaded and was  started on Lasix to which he is responding well.  He is ambulating in  the halls without problem with cardiac rehab  phase I.  He is tolerating  regular diet.  His incisions are all healing well.  He has remained  afebrile and his other vital signs have been stable.  His INRs trending  upward, currently is still subtherapeutic at 1.4 with a PT of 17.5.  It  is recommended that he continue with Lovenox and Coumadin until his INR  reaches 2.5 and the Lovenox may be discontinued.  His other recent labs  show a sodium of 138, potassium 4.2, BUN 11, creatinine 0.7, and  hematocrit of 39.  He will be observed closely over the next 48 hours  and hopefully if his rhythm remains stable and his anticoagulation is  therapeutic, he will be ready for discharge home pending morning round  evaluation.   DISCHARGE MEDICATIONS:  As follows:  1. Coumadin home dose to be determined by PT and INR drawn on the day      of discharge.  2. Lopressor 50 mg b.i.d.  3. Amiodarone 400 mg b.i.d. x1 week and then 2 mg b.i.d.  4. Tylox 1-2 q.4 h p.r.n. for pain.  5. Lasix 40 mg daily x1 week.  6. Potassium 20 mEq daily x1 week.  7. Ranitidine 150 mg b.i.d.  8. Sulfasalazine 2500 mg q.a.m. and 2000 mg at bedtime.  9. Folic acid 800 mcg daily.   DISCHARGE INSTRUCTIONS:  He is asked to refrain from driving, heavy  lifting, or strenuous activity.  He may continue ambulating daily and  using his incentive spirometer.  He may shower daily and clean his  incisions with soap and water.  He will continue same preoperative diet.   DISCHARGE FOLLOWUP:  He will need to make an appointment to see Dr.  Jens Som in 2 weeks.  He will then follow up with Dr. Dennie Maizes PA on  September 23, 2008, with a chest x-ray.  He will need to follow up within  48 hours of discharge with Dr. Myna Hidalgo in the [facility] office for  recheck of his PT and INR and to continue his  hematology workup for hypercoagulability.  He will also call Dr. Earlene Plater'  office for followup of his left kidney mass as an outpatient.  In the  interim, if he experiences problems or  has questions he is asked to  contact our office immediately.      Coral Ceo, P.A.      Sheliah Plane, MD  Electronically Signed    GC/MEDQ  D:  09/06/2008  T:  09/06/2008  Job:  [id number]   cc:   Windle Guard, M.D.  Madolyn Frieze Jens Som, MD, [facility]  Lucrezia Starch. Earlene Plater, M.D.  Rose Phi. Myna Hidalgo, M.D.

## 2011-04-27 NOTE — Assessment & Plan Note (Signed)
[facility]                            CARDIOLOGY OFFICE NOTE   WYLIE, COON                       MRN:          [id number]  DATE:02/26/2009                            DOB:          [birth year]    [name] is a pleasant gentleman who has history of coronary artery  disease, status post coronary bypassing graft in September 2009.  He was  also found to have a DVT on that admission and was placed on Coumadin.  He also was found to have a renal mass, which was ultimately resected  and found to be benign by his report.  I last saw him on January 01, 2009.  Since that time, he does have dyspnea with more extreme  activities, but this is relieved promptly with rest and it does not  occur with routine activities.  There is no orthopnea or PND, but there  is mild pedal edema.  There is no chest pain, palpitations, or syncope.   PRESENT MEDICATIONS:  1. Sulfasalazine 500 mg tablets 5 in the morning and 4 in the      afternoon.  2. Folate.  3. Metoprolol 100 mg p.o. b.i.d.  4. Zantac 150 mg p.o. b.i.d.  5. Aspirin 81 mg p.o. daily.  6. Coumadin as directed.  7. Pravachol 80 mg p.o. daily.  8. Lasix 40 mg p.o. daily.  9. Potassium 10 mEq p.o. daily.  10.Oxycodone.  11.He also takes lisinopril 10 mg p.o. daily.   PHYSICAL EXAMINATION:  VITAL SIGNS:  Blood pressure of 140/90 and his  pulse is 68.  HEENT:  Normal.  NECK:  Supple.  CHEST:  Clear.  CARDIOVASCULAR:  Regular rhythm.  ABDOMEN:  No tenderness.  EXTREMITIES:  Trace edema.   DIAGNOSES:  1. Coronary artery disease, status post coronary artery bypassing      graft - the patient is doing well from symptomatic standpoint.  He      will continue with his aspirin, beta-blocker, statin, and ACE      inhibitor.  I have stressed the importance of diet and exercise.  2. Postoperative atrial fibrillation - the patient remains in sinus      rhythm on physical exam today.  3. Chronic obstructive  pulmonary disease.  4. History of deep venous thrombosis - the patient has been seen by      Dr. Myna Hidalgo as there is a strong family history of protein S      deficiency.  Dr. Myna Hidalgo recommended Coumadin for 1 year and then      discontinue.  We would then check his protein S level at that point      2 months after discontinuing his Coumadin.  Note, his Coumadin is      being followed in [facility].  5. History of renal mass, status post resection.  6. Hypertension - his blood pressure is elevated today.  We will      increase his lisinopril to 20 mg daily.  I will check a BMET in 1      week to follow his potassium and  renal function.  7. Hyperlipidemia - he will continue his Pravachol.  His LDL is not at      goal, but the patient states he cannot afford Crestor or Lipitor.      We will continue with his present drug.  8. History of Crohn disease.  9. Gastroesophageal reflux disease.   He will see me back in 6 months.     Madolyn Frieze Jens Som, MD, [facility]  Electronically Signed    BSC/MedQ  DD: 02/26/2009  DT: 02/26/2009  Job #: [id number]

## 2011-04-27 NOTE — Op Note (Signed)
[name], [name] NO.:  [id number]   MEDICAL RECORD NO.:  [id number]           PATIENT TYPE:   LOCATION:                                 FACILITY:   PHYSICIAN:  Sheliah Plane, MD         DATE OF BIRTH:   DATE OF PROCEDURE:  09/02/2008  DATE OF DISCHARGE:                               OPERATIVE REPORT   PREOPERATIVE DIAGNOSIS:  Coronary artery occlusive disease.   POSTOPERATIVE DIAGNOSIS:  Coronary artery occlusive disease.   SURGICAL PROCEDURES:  Coronary artery bypass grafting x4 with left  internal mammary to the left anterior descending coronary artery,  reverse saphenous vein graft to the diagonal coronary artery, reverse  saphenous vein graft to the obtuse marginal coronary artery, reverse  saphenous vein graft to the distal right coronary artery with right  thigh and calf endovein harvesting and placement of femoral arterial  line.   SURGEON:  Sheliah Plane, MD   FIRST ASSISTANT:  Coral Ceo, PA   BRIEF HISTORY:  The patient is a 65 year old male who presented with a  swollen left leg.  Evaluation revealed DVT in the left leg, none in the  right leg.  Ultimately, a CT scan of the chest and abdomen showed no  evidence of pulmonary emboli.  The patient did have a small lesion in  the kidney that was suggestive of an early renal cell carcinoma.  While  the patient was being treated for his DVT, he also mentioned that he had  been having anginal symptoms.  Cardiology consulted and ultimately a  cardiac catheterization was performed, which demonstrated an ostial  right coronary lesion of greater than 90% and 80% left main and 80%  diagonal lesion.  Because of the patient's left main 3-vessel disease,  coronary artery bypass grafting was recommended, the patient agreed and  signed informed consent.   DESCRIPTION OF PROCEDURE:  With Swan-Ganz and arterial line monitors in  place, the patient underwent general endotracheal anesthesia without  incidence given the chest and legs prepped with Betadine and draped in  the usual sterile manner.  Using the Guidant endovein harvesting system,  vein was harvested from the right thigh and calf and was of good quality  and caliber without any evidence of DVT.  Median sternotomy was  performed.  Left internal mammary artery was dissected down as a pedicle  graft.  The distal artery was divided, good free flow.  Pericardium was  opened.  Overall, ventricular function appeared preserved.  The patient  did have obvious intramyocardial LAD and intramyocardial obtuse marginal  vessel.  It was systemically heparinized.  Ascending aorta was  cannulated.  The right atrium was cannulated.  An aortic root vent and  cardioplegia needle was introduced into the ascending aorta.  The  patient was placed on cardiopulmonary bypass at 2.4 L per minute per  meter squared.  Sites of anastomosis were dissected out of the  epicardium.  The patient's body temperature was cooled to 30 degrees.  The aortic cross-clamp was applied.  A 500 mL of cold blood potassium  cardioplegia was administered with diastolic arrest of the heart.  Myocardial septal temperature was monitored throughout the cross-clamp.  Attention was turned first to the distal right coronary artery, which  was opened, it admitted a 1.5-mm probe.  Using a running 7-0 Prolene, a  second reverse saphenous vein graft was anastomosed to the distal right  coronary artery.  Attention was then turned to the obtuse marginal  coronary artery.  This vessel was intramyocardial and this has been  dissected out of its intramyocardial position, opened, vessel was a  small vessel just over 1 mm in size.  Using a running 8-0 Prolene, the  second reverse saphenous vein graft was anastomosed to the obtuse  marginal vessel.  In a similar fashion, the diagonal coronary artery was  opened, it also was small and admitted a 1-mm probe.  Using running 8-0  Prolene, second  reverse saphenous vein graft was anastomosed to the  diagonal.  The left internal mammary artery was also a small vessel, was  dissected out of its intramyocardial position.  A 1-mm probe passed  distally.  Using running 8-0 Prolene, the left internal mammary artery  was anastomosed to left anterior descending coronary artery.  With  release of the bulldog from mammary artery, there was rise in myocardial  septal temperature.  Bulldog was placed back on the mammary artery.  Additional cold blood cardioplegia was administered.  With the cross-  clamp still in place, 3 punch aortotomies were performed and each of 3  vein grafts anastomosed to the ascending aorta.  Air was evacuated from  the ascending aorta and grafts and aortic cross-clamp was removed with  total cross-clamp time of 70 minutes.  The patient spontaneously  converted to a sinus rhythm.  Atrial and ventricular pacing wires were  applied.  Sites of anastomosis were inspected, were free of bleeding.  With the patient's body temperature rewarmed, he was ventilated, weaned  from cardiopulmonary bypass without difficulty, remained hemodynamically  stable.  He was decannulated in usual fashion.  Protamine sulfate was  administered.  Pericardium was reapproximated.  A left pleural tube and  a Blake mediastinal drain were left in place.  Sternum was closed with  #6 stainless steel wire.  Fascia closed with interrupted 0 Vicryl,  running 3-0 Vicryl in subcutaneous tissue, and 4-0 subcuticular stitch  in skin edges.  Dry dressings were applied.  Sponge and needle count was  reported as correct at the completion of the procedure.  The patient  tolerated the procedure without obvious complication and was transferred  to Surgical Intensive Care Unit for further postoperative care.      Sheliah Plane, MD  Electronically Signed     EG/MEDQ  D:  09/03/2008  T:  09/04/2008  Job:  [id number]   cc:   Madolyn Frieze. Jens Som, MD, [facility]

## 2011-04-27 NOTE — Assessment & Plan Note (Signed)
OFFICE VISIT   Wesley [name], Wesley [name]  DOB:  01/05/1946                                        September 23, 2008  CHART #:  [id number]   HISTORY:  The patient returns for an office evaluation on today's date  with report of gaining approximately 9 pounds in the past 4 days.  He  was previously seen by Dr. Donata [name] on September 13, 2008, and given  prescriptions for additional diuretic that included Lasix and Zaroxolyn  as well as some potassium.  On today's date, he denies any significant  sodium intake.  His appetite has improved, but he is not overeating in  his estimate.  He denies significant shortness of breath.  He denies  chest pain.  He denies fevers, chills, or other constitutional symptoms.  He denies diarrhea specifically.  He does have [name] history of Crohn  disease.   PHYSICAL EXAMINATION:  VITAL SIGNS:  Blood pressure is 127/82, pulse is  72, respirations 16, and oxygen saturation is 97% on room air.  GENERAL:  [name], moderately obese male in no acute  distress.  PULMONARY:  Clear lungs without crackles or wheeze.  CARDIOVASCULAR:  Regular rate and rhythm.  Normal S1 and S2 without  murmur, gallop, or rub.  ABDOMEN:  Moderately distended positive bowel sounds, nontender.  He  does have an umbilical hernia, but it is easily reducible, nontender.  EXTREMITIES:  Minor edema in the lower calves and ankles.  There is no  jugular venous distention.   ASSESSMENT:  Wesley [name] is having unusual weight gain.  There may be [name]  component of volume overload, but this is not entirely clear and he does  not appear to be in frank congestive heart failure.  He is scheduled to  see Dr. Jens [name] on Wednesday of this week.  I will obtain [name] chest x-ray  as well as [name] basic metabolic panel and beta-natriuretic peptide (BNP).  These results will be available tomorrow and we can further plan any  additional therapy.  Additionally as  stated, he is scheduled to  see his cardiologist, Dr. Jens [name], on  Wednesday.  As for now, he does not appear in any distress and is  tolerating routine activities without shortness of breath.   Wesley [name], P.[name].-C.   Wesley [name]  D:  09/23/2008  T:  09/23/2008  Job:  [id number]   cc:   Wesley [name]. Wesley Som, MD, [facility]

## 2011-04-27 NOTE — Discharge Summary (Signed)
NAMEBENARD, MINTURN NO.:  [id number]   MEDICAL RECORD NO.:  [id number]          PATIENT TYPE:  INP   LOCATION:  2036                         FACILITY:  [facility]   PHYSICIAN:  Sheliah Plane, MD    DATE OF BIRTH:  Mar 16, 1946   DATE OF ADMISSION:  08/23/2008  DATE OF DISCHARGE:  09/10/2008                               DISCHARGE SUMMARY   ADDENDUM    [name] was originally scheduled for discharge to home on the morning  September 08, 2008.  However, on that morning he was noted to having  frequent runs of atrial fibrillation and was felt that he should stay in  the hospital for an additional day to further evaluate and treat his  atrial fibrillation.  Cardiology saw the patient and gave him a bolus of  amiodarone as well as continuing his p.o. amiodarone and Lopressor.  His  blood pressure had initially been trending upward and he was started on  his full home dose of enalapril, however, his blood pressure did drop  into the 90-100 systolic range and the enalapril was subsequently  discontinued altogether.  His Lopressor was increased to 100 mg b.i.d.  in order to help with both his blood pressure and heart rhythm.  He was  continued on anticoagulation for his lower extremity DVT.  His INR on  September 09, 2008, was 3.3 and his Coumadin was held as this was  supratherapeutic.  He has remained stable otherwise.  At the time of  discharge, his blood pressure has continued to run in the 90-100  systolic range which Cardiology has deemed acceptable for him.  He is  also mostly maintained in sinus rhythm with few PACs.  His incisions are  all healing well.  His INR today is 3.1 with a PT of 34.1 and we will  plan to hold his Coumadin again today and restarted on 2.5 mg on  Wednesday.  He has been seen and evaluated this morning prior to  discharge both by Dr. Jens Som and Dr. Tyrone Sage and is deemed ready for  discharge home.   His corrected medication list at the  time of discharge is as follows:  1. Aspirin 81 mg daily.  2. Coumadin none on September 10, 2008, and we will restart on      September 11, 2008, at 2.5 mg with blood work to be drawn on      Thursday September 12, 2008.  3. Lopressor 100 mg b.i.d.  4. Amiodarone 400 mg b.i.d. x2 weeks then decrease to 200 mg b.i.d. x2      weeks then 200 mg daily (anticipated length of continuation 10-12      weeks with if AFib remains resolved).  5. Ranitidine 150 mg b.i.d.  6. Sulfasalazine 2500 mg q.a.m. and 2000 mg nightly.  7. Folic acid 800 mcg daily.   Discharge instructions are unchanged from the previously dictated  discharge summary.   DISCHARGE FOLLOWUP:  He will have his INR checked at the [facility] on September 12, 2008, at 12:30 p.m.  He will follow up  with Dr. Jens Som in the [facility] Cardiology office on September 25, 2008, at 9:15 a.m.  He will see Dr. Dennie Maizes PA as previously  scheduled.  He will need to call and make appointment to see Dr. Earlene Plater  and Dr. Myna Hidalgo for recheck.      Coral Ceo, P.A.      Sheliah Plane, MD  Electronically Signed    GC/MEDQ  D:  09/10/2008  T:  09/10/2008  Job:  [id number]   cc:   Windle Guard, M.D.  Madolyn Frieze Jens Som, MD, [facility]  Lucrezia Starch. Earlene Plater, M.D.  Rose Phi. Myna Hidalgo, M.D.

## 2011-04-27 NOTE — Discharge Summary (Signed)
[name], [name]                ACCOUNT NO.:  [id number]   MEDICAL RECORD NO.:  [id number]          PATIENT TYPE:  INP   LOCATION:  1428                         FACILITY:  [facility]   PHYSICIAN:  Bertram Millard. Dahlstedt, M.D.DATE OF BIRTH:  July 24, 1946   DATE OF ADMISSION:  11/20/2008  DATE OF DISCHARGE:  11/24/2008                               DISCHARGE SUMMARY   PRIMARY DIAGNOSES:  1. Cyst of left kidney.  2. Accidental injury to the left ureter during procedure.  3. Chronic obstructive pulmonary disease.  4. History of congestive heart failure.  5. Umbilical hernia.  6. Hypertension.  7. History of coronary artery disease.   BRIEF HISTORY:  This 65 year old male was referred to me by Dr. Darvin Neighbours' office for consideration of left laparoscopic partial  nephrectomy.  The patient has an enhancing small tumor on the left  lateral kidney.  Ultrasound and CT revealed suspicious for a solid  lesion.  He presents at this time for attempted laparoscopic  cryoablation of this renal mass.   HOSPITAL COURSE:  The patient was admitted directly to the operating  room.  He underwent a laparoscopy and mobilization of the left kidney.  During this procedure, it was noted that the patient had an injury to  his left proximal ureter.  This was identified.  At this time, this was  converted to an open procedure.  The left ureter was identified and  ureterostomy was performed.  In addition, the previously noted lesion on  the left kidney was excised.  It looked to be a simple cyst which had  not been identified radiographically preoperatively.  A stent was left  in his left ureter and it drained.  The patient did have some bloating  postoperatively.  He did have some mild shortness of breath which was  treated with nebulizers.  He  progressed appropriately.  By November 24, 2008, he was eating a regular diet, he had a bowel movement and was  passing flatus.  He was feeling much better.  There was  no drainage from  his JP drain which was removed.  He was discharged from the hospital at  that time.   DISCHARGE INSTRUCTIONS:  1. He was discharged on a regular diet.  2. He will continue his Lovenox and Coumadin.  3. He will follow with Dr. Olga Millers postoperatively.  4. For discharge medications, please see the medication reconciliation      sheet.  5. He is discharged and will follow up with me in about a week for      staple removal.      Bertram Millard. Dahlstedt, M.D.  Electronically Signed     SMD/MEDQ  D:  01/02/2009  T:  01/02/2009  Job:  [id number]

## 2011-04-27 NOTE — Cardiovascular Report (Signed)
[name], ATOR NO.:  [id number]   MEDICAL RECORD NO.:  [id number]          PATIENT TYPE:  INP   LOCATION:  2807                         FACILITY:  [facility]   PHYSICIAN:  Veverly Fells. Excell Seltzer, MD  DATE OF BIRTH:  1946-02-12   DATE OF PROCEDURE:  08/28/2008  DATE OF DISCHARGE:                            CARDIAC CATHETERIZATION   PROCEDURE:  Left heart catheterization, selective coronary angiography,  left ventricular angiography, left internal mammary artery angiography  and Angio-Seal of the right femoral artery.   INDICATIONS:  [name] is a 65 year old gentleman who presented with a  left leg DVT.  He reported typical exertional chest pain.  He also has  been found to have a left renal lesion concerning for renal cell cancer.  In this setting, cardiac catheterization was recommended to define his  coronary anatomy.   The risks and indications of the procedure were reviewed with the  patient and informed consent was obtained.  The right groin was prepped,  draped and anesthetized with 1% lidocaine.  Using modified Seldinger  technique, a 5-French sheath was placed in the right femoral artery.  Standard 5-French Judkins catheters were used for coronary angiography.  No torque right catheter was used to image the right coronary artery, as  well as the left subclavian and LIMA.  A pigtail catheter was used for  ventriculography.  A pullback across the aortic valve was done.  All  catheter exchanges were performed over a guidewire.  At the completion  of the procedure, an Angio-Seal device was used to close the femoral  arteriotomy.  The patient tolerated the procedure well.  There were no  immediate complications.   FINDINGS:  Aortic pressure 178/94 with a mean of 113, left ventricular  pressure 175/19.   CORONARY ANGIOGRAPHY:  Left mainstem:  The left mainstem is moderately  calcified and there is severe diffuse 75% stenosis of the left main.  The caliber of  the left main is smaller than either the LAD or the left  circumflex and there is pressure damping with a 5-French diagnostic  catheter into the left main.  The vessel bifurcates into the LAD and  left circumflex.   LAD:  The LAD is heavily calcified throughout the proximal region.  There is nonobstructive plaque without luminal compromise.  The first  diagonal branch is widely patent, it is a moderate vessel.  The second  diagonal branch has a 50% ostial stenosis.  The LAD at the origin of the  second diagonal also has a 50% calcific eccentric stenosis.  The  remaining portions of the mid and distal LAD have no significant  stenosis.  The LAD wraps around the LV apex.   Left circumflex.  The left circumflex is a moderate-sized vessel.  There  is mild ostial stenosis present.  The circumflex supplies a first OM  branch that bifurcates into twin vessels.  It is a moderate size vessel.  The second OM is relatively small.  The AV groove circumflex courses  down and supplies an atrial branch, as well as a left posterolateral  branch.  There is no significant stenosis in the remaining portions of  the circumflex.   Right coronary artery:  The right coronary artery has a heavily  calcified ostium.  There is severe stenosis at the ostium of  approximately 90%.  The vessel courses down and supplies a PDA branch,  as well as an acute marginal branch.  There are three posterolaterals;  the first two are small and the third posterolateral is a relatively  large vessel.  The remaining portions of the right coronary artery and  its branch vessels have minor nonobstructive disease.   Left subclavian artery widely patent.  No pressure gradient.   LIMA:  Widely patent, suitable for a vascular conduit.   Left ventriculography.  Left ventricular function is normal.  The LVEF  is estimated at 60%.  There is no significant mitral regurgitation.   ASSESSMENT:  1. Severe left mainstem disease.  2.  Severe ostial right coronary artery stenosis.  3. Preserved left ventricular function.  4. Moderate mid left anterior descending and second diagonal stenosis.  5. Nonobstructive left circumflex stenosis.   PLAN:  The patient has surgical anatomy.  He has good target vessels and  appears to be a good candidate for surgery from a cardiac standpoint.  However, his overall medical situation is complicated by an acute left  leg DVT, as well as an indeterminate left kidney lesion in the presence  of Crohn's disease with intermittent bleeding.  I have reviewed this  case with Dr. Jens Som and Dr. Darnelle Catalan.  A consult will be placed to [facility].  Further plans pending their evaluation.      Veverly Fells. Excell Seltzer, MD  Electronically Signed     MDC/MEDQ  D:  08/28/2008  T:  08/28/2008  Job:  [id number]

## 2011-04-27 NOTE — Assessment & Plan Note (Signed)
OFFICE VISIT   [name], [name]  DOB:  30-Nov-1946                                        September 13, 2008  CHART #:  [id number]   CURRENT PROBLEMS:  1. Status post coronary bypass grafting x4, 09/02/2008 by Dr. Gerre Pebbles      for severe three-vessel coronary disease.  2. Postoperative atrial fibrillation, currently treated with      amiodarone and Coumadin with an INR 1.5, performed today.  3. Over night weight gain of 4 pounds with associated acute dyspnea on      exertion.  4. History of hypertension and Crohn disease.   HISTORY PRESENT ILLNESS:  The patient returns for a check due to a  overnight weight gain of over 4 pounds with associated dyspnea on  exertion.  He denies chest pain.  He denies any real salty foods being  consumed overnight.  He has dyspnea on exertion but denies orthopnea or  PND.  He is on amiodarone and Coumadin for postoperative AFib as well as  atenolol, Cardizem, Vasotec, and sulfasalazine and p.r.n. Tylox.  He  takes aspirin 81 mg daily.   PHYSICAL EXAMINATION:  Blood pressure 120/70, pulse 74 with ectopy  (probable sinus rhythm with PVCs), saturation on room air 97%.  He is  alert and pleasant and slightly short of breath.  Breath sounds however  are clear.  Cardiac rhythm is a sinus with ectopy.  There is no S3  gallop or murmur.  The sternum is well-healed.  The leg incision is well-  healed.  He has bilateral ankle edema.   IMAGING:  PA and lateral chest x-ray shows some postoperative mild  basilar atelectasis, no significant effusions.  There is no significant  pulmonary edema.   IMPRESSION AND PLAN:  The patient has fluid retention and needs to be  placed on Lasix which was not continued when he went home.  I have  provided him Lasix 40 mg b.i.d. for 5 days and Zeroxin 5 mg once a day  for 5 days with associated potassium 20 mEq daily.  He will return  to keep his prearranged followup appointment in 10 days.  If this is  not  improved, he will call our office and I will revise the plan with him  this week.   Kerin Perna, M.D.  Electronically Signed   PV/MEDQ  D:  09/13/2008  T:  09/13/2008  Job:  [id number]

## 2011-07-27 ENCOUNTER — Encounter: Payer: Self-pay | Admitting: Cardiology

## 2011-08-19 ENCOUNTER — Other Ambulatory Visit: Payer: Self-pay | Admitting: Cardiology

## 2011-09-13 LAB — POCT I-STAT 4, (NA,K, GLUC, HGB,HCT)
Glucose, Bld: 103 — ABNORMAL HIGH
Glucose, Bld: 107 — ABNORMAL HIGH
Glucose, Bld: 113 — ABNORMAL HIGH
Glucose, Bld: 118 — ABNORMAL HIGH
Glucose, Bld: 120 — ABNORMAL HIGH
Glucose, Bld: 145 — ABNORMAL HIGH
HCT: 26 — ABNORMAL LOW
HCT: 28 — ABNORMAL LOW
HCT: 36 — ABNORMAL LOW
Hemoglobin: 9.5 — ABNORMAL LOW
Hemoglobin: 9.9 — ABNORMAL LOW
Potassium: 3.9
Potassium: 4.8
Potassium: 4.8
Sodium: 134 — ABNORMAL LOW

## 2011-09-13 LAB — IRON AND TIBC: TIBC: 368

## 2011-09-13 LAB — POCT I-STAT, CHEM 8
BUN: 11
BUN: 13
Calcium, Ion: 1.13
Chloride: 106
Chloride: 98
Creatinine, Ser: 1
Glucose, Bld: 126 — ABNORMAL HIGH
Glucose, Bld: 147 — ABNORMAL HIGH
HCT: 33 — ABNORMAL LOW
Potassium: 4.6

## 2011-09-13 LAB — GLUCOSE, CAPILLARY
Glucose-Capillary: 101 — ABNORMAL HIGH
Glucose-Capillary: 105 — ABNORMAL HIGH
Glucose-Capillary: 106 — ABNORMAL HIGH
Glucose-Capillary: 109 — ABNORMAL HIGH
Glucose-Capillary: 109 — ABNORMAL HIGH
Glucose-Capillary: 111 — ABNORMAL HIGH
Glucose-Capillary: 115 — ABNORMAL HIGH
Glucose-Capillary: 117 — ABNORMAL HIGH
Glucose-Capillary: 117 — ABNORMAL HIGH
Glucose-Capillary: 118 — ABNORMAL HIGH
Glucose-Capillary: 123 — ABNORMAL HIGH
Glucose-Capillary: 128 — ABNORMAL HIGH
Glucose-Capillary: 128 — ABNORMAL HIGH
Glucose-Capillary: 130 — ABNORMAL HIGH
Glucose-Capillary: 131 — ABNORMAL HIGH
Glucose-Capillary: 132 — ABNORMAL HIGH
Glucose-Capillary: 142 — ABNORMAL HIGH
Glucose-Capillary: 147 — ABNORMAL HIGH
Glucose-Capillary: 148 — ABNORMAL HIGH
Glucose-Capillary: 152 — ABNORMAL HIGH
Glucose-Capillary: 169 — ABNORMAL HIGH
Glucose-Capillary: 176 — ABNORMAL HIGH
Glucose-Capillary: 221 — ABNORMAL HIGH
Glucose-Capillary: 72
Glucose-Capillary: 99

## 2011-09-13 LAB — BASIC METABOLIC PANEL
BUN: 10
BUN: 11
BUN: 11
BUN: 12
BUN: 13
BUN: 8
BUN: 9
CO2: 27
CO2: 29
CO2: 29
CO2: 29
CO2: 29
CO2: 29
CO2: 30
Calcium: 8 — ABNORMAL LOW
Calcium: 8.1 — ABNORMAL LOW
Calcium: 8.3 — ABNORMAL LOW
Calcium: 8.6
Calcium: 8.6
Calcium: 8.8
Calcium: 8.8
Chloride: 104
Chloride: 104
Chloride: 104
Chloride: 104
Chloride: 104
Chloride: 110
Chloride: 99
Creatinine, Ser: 0.65
Creatinine, Ser: 0.71
Creatinine, Ser: 0.73
Creatinine, Ser: 0.74
Creatinine, Ser: 0.77
Creatinine, Ser: 0.79
Creatinine, Ser: 0.82
GFR calc Af Amer: >60
GFR calc Af Amer: >60
GFR calc Af Amer: >60
GFR calc Af Amer: >60
GFR calc Af Amer: >60
GFR calc Af Amer: >60
GFR calc Af Amer: >60
GFR calc non Af Amer: >60
GFR calc non Af Amer: >60
GFR calc non Af Amer: >60
GFR calc non Af Amer: >60
GFR calc non Af Amer: >60
GFR calc non Af Amer: >60
Glucose, Bld: 103 — ABNORMAL HIGH
Glucose, Bld: 104 — ABNORMAL HIGH
Glucose, Bld: 105 — ABNORMAL HIGH
Glucose, Bld: 106 — ABNORMAL HIGH
Glucose, Bld: 107 — ABNORMAL HIGH
Glucose, Bld: 110 — ABNORMAL HIGH
Glucose, Bld: 131 — ABNORMAL HIGH
Potassium: 3.7
Potassium: 4.2
Potassium: 4.2
Potassium: 4.2
Potassium: 4.6
Potassium: 4.6
Sodium: 136
Sodium: 138
Sodium: 140
Sodium: 140
Sodium: 141
Sodium: 141

## 2011-09-13 LAB — CROSSMATCH
ABO/RH(D): O POS
Antibody Screen: NEGATIVE

## 2011-09-13 LAB — CBC
HCT: 27 — ABNORMAL LOW
HCT: 28.4 — ABNORMAL LOW
HCT: 28.8 — ABNORMAL LOW
HCT: 30.4 — ABNORMAL LOW
HCT: 31 — ABNORMAL LOW
HCT: 32 — ABNORMAL LOW
HCT: 37.4 — ABNORMAL LOW
HCT: 41
Hemoglobin: 10 — ABNORMAL LOW
Hemoglobin: 10.4 — ABNORMAL LOW
Hemoglobin: 10.5 — ABNORMAL LOW
Hemoglobin: 12.2 — ABNORMAL LOW
Hemoglobin: 12.4 — ABNORMAL LOW
Hemoglobin: 12.6 — ABNORMAL LOW
Hemoglobin: 8.8 — ABNORMAL LOW
Hemoglobin: 9.3 — ABNORMAL LOW
Hemoglobin: 9.4 — ABNORMAL LOW
MCHC: 32.4
MCHC: 32.6
MCHC: 32.7
MCHC: 32.7
MCHC: 32.7
MCHC: 32.8
MCHC: 32.9
MCHC: 32.9
MCHC: 33
MCHC: 33.5
MCHC: 33.7
MCHC: 33.7
MCV: 93.4
MCV: 93.6
MCV: 94.9
MCV: 95
MCV: 95.1
MCV: 95.1
MCV: 95.2
MCV: 95.2
MCV: 95.5
MCV: 95.6
Platelets: 166
Platelets: 183
Platelets: 185
Platelets: 190
Platelets: 197
Platelets: 212
Platelets: 248
Platelets: 257
Platelets: 260
Platelets: 264
Platelets: 271
Platelets: 319
RBC: 2.83 — ABNORMAL LOW
RBC: 2.99 — ABNORMAL LOW
RBC: 3.03 — ABNORMAL LOW
RBC: 3.18 — ABNORMAL LOW
RBC: 3.32 — ABNORMAL LOW
RBC: 3.37 — ABNORMAL LOW
RBC: 3.88 — ABNORMAL LOW
RBC: 3.89 — ABNORMAL LOW
RBC: 3.92 — ABNORMAL LOW
RBC: 3.93 — ABNORMAL LOW
RBC: 4 — ABNORMAL LOW
RDW: 14.2
RDW: 14.3
RDW: 14.4
RDW: 14.4
RDW: 14.4
RDW: 14.5
RDW: 14.5
RDW: 14.5
RDW: 14.6
RDW: 14.8
WBC: 12.8 — ABNORMAL HIGH
WBC: 15.6 — ABNORMAL HIGH
WBC: 7.2
WBC: 7.5
WBC: 7.5
WBC: 7.6
WBC: 8.2
WBC: 8.9
WBC: 9
WBC: 9.4

## 2011-09-13 LAB — BLOOD GAS, ARTERIAL
Acid-Base Excess: 0.3
Acid-Base Excess: 0.7
Bicarbonate: 24.2 — ABNORMAL HIGH
Bicarbonate: 25.4 — ABNORMAL HIGH
Drawn by: 24486
FIO2: 0.21
FIO2: 0.21
O2 Saturation: 92.9
O2 Saturation: 94.6
Patient temperature: 98.3
Patient temperature: 98.6
TCO2: 25.3
TCO2: 26.7
pCO2 arterial: 37.3
pCO2 arterial: 44.3
pH, Arterial: 7.375
pH, Arterial: 7.428
pO2, Arterial: 72.7 — ABNORMAL LOW
pO2, Arterial: 77.7 — ABNORMAL LOW

## 2011-09-13 LAB — CREATININE, SERUM
Creatinine, Ser: 0.75
Creatinine, Ser: 0.81
GFR calc Af Amer: >60
GFR calc Af Amer: >60
GFR calc non Af Amer: >60
GFR calc non Af Amer: >60

## 2011-09-13 LAB — PROTIME-INR
INR: 1.2
INR: 1.4
INR: 1.4
INR: 1.4
INR: 1.4
INR: 1.4
INR: 1.5
INR: 1.5
INR: 1.6 — ABNORMAL HIGH
INR: 2.5 — ABNORMAL HIGH
INR: 3.1 — ABNORMAL HIGH
INR: 3.3 — ABNORMAL HIGH
Prothrombin Time: 17.5 — ABNORMAL HIGH
Prothrombin Time: 17.6 — ABNORMAL HIGH
Prothrombin Time: 17.6 — ABNORMAL HIGH
Prothrombin Time: 17.8 — ABNORMAL HIGH
Prothrombin Time: 18.7 — ABNORMAL HIGH
Prothrombin Time: 19.2 — ABNORMAL HIGH
Prothrombin Time: 28.3 — ABNORMAL HIGH
Prothrombin Time: 34.1 — ABNORMAL HIGH
Prothrombin Time: 35.9 — ABNORMAL HIGH

## 2011-09-13 LAB — POCT I-STAT 3, ART BLOOD GAS (G3+)
Acid-base deficit: 1
Acid-base deficit: 1
Bicarbonate: 25.5 — ABNORMAL HIGH
Bicarbonate: 25.8 — ABNORMAL HIGH
Bicarbonate: 27.4 — ABNORMAL HIGH
Bicarbonate: 27.8 — ABNORMAL HIGH
O2 Saturation: 96
O2 Saturation: 97
Patient temperature: 36.7
TCO2: 27
TCO2: 28
TCO2: 29
pCO2 arterial: 42.9
pCO2 arterial: 47.6 — ABNORMAL HIGH
pCO2 arterial: 48.5 — ABNORMAL HIGH
pH, Arterial: 7.378
pH, Arterial: 7.419
pO2, Arterial: 103 — ABNORMAL HIGH

## 2011-09-13 LAB — COMPREHENSIVE METABOLIC PANEL
AST: 34
Albumin: 3.7
BUN: 10
Creatinine, Ser: 0.74
GFR calc Af Amer: >60
Total Protein: 6.9

## 2011-09-13 LAB — URINALYSIS, ROUTINE W REFLEX MICROSCOPIC
Bilirubin Urine: NEGATIVE
Glucose, UA: NEGATIVE
Hgb urine dipstick: NEGATIVE
Ketones, ur: NEGATIVE
Nitrite: NEGATIVE
Protein, ur: NEGATIVE
Specific Gravity, Urine: 1.02
Urobilinogen, UA: 1
pH: 7

## 2011-09-13 LAB — DIFFERENTIAL
Basophils Absolute: 0.1
Basophils Relative: 1
Basophils Relative: 1
Basophils Relative: 1
Eosinophils Absolute: 0.3
Eosinophils Absolute: 0.3
Eosinophils Absolute: 0.3
Lymphocytes Relative: 18
Lymphs Abs: 1.7
Monocytes Absolute: 0.6
Monocytes Relative: 6
Monocytes Relative: 8
Monocytes Relative: 8
Neutro Abs: 5.1
Neutro Abs: 5.6
Neutro Abs: 6.6
Neutrophils Relative %: 69
Neutrophils Relative %: 70
Neutrophils Relative %: 70

## 2011-09-13 LAB — MAGNESIUM
Magnesium: 2.2
Magnesium: 2.6 — ABNORMAL HIGH
Magnesium: 3.1 — ABNORMAL HIGH

## 2011-09-13 LAB — ABO/RH: ABO/RH(D): O POS

## 2011-09-13 LAB — LIPID PANEL
Cholesterol: 241 — ABNORMAL HIGH
Total CHOL/HDL Ratio: 5.7
VLDL: 39

## 2011-09-13 LAB — APTT
aPTT: 103 — ABNORMAL HIGH
aPTT: 34

## 2011-09-13 LAB — HEMOGLOBIN A1C
Hgb A1c MFr Bld: 4.9
Mean Plasma Glucose: 94

## 2011-09-13 LAB — POCT I-STAT 3, VENOUS BLOOD GAS (G3P V)
Bicarbonate: 27.3 — ABNORMAL HIGH
pH, Ven: 7.355 — ABNORMAL HIGH
pO2, Ven: 52 — ABNORMAL HIGH

## 2011-09-13 LAB — RETICULOCYTES: Retic Ct Pct: 2.6

## 2011-09-13 LAB — HEMOGLOBIN AND HEMATOCRIT, BLOOD
HCT: 27.7 — ABNORMAL LOW
Hemoglobin: 9.1 — ABNORMAL LOW

## 2011-09-13 LAB — POCT I-STAT GLUCOSE
Glucose, Bld: 112 — ABNORMAL HIGH
Glucose, Bld: 116 — ABNORMAL HIGH
Operator id: 190281

## 2011-09-13 LAB — HEPARIN LEVEL (UNFRACTIONATED)
Heparin Unfractionated: 0.24 — ABNORMAL LOW
Heparin Unfractionated: 0.41
Heparin Unfractionated: 0.54

## 2011-09-13 LAB — FOLATE: Folate: >20

## 2011-09-13 LAB — CARDIAC PANEL(CRET KIN+CKTOT+MB+TROPI)
CK, MB: 1.9
Total CK: 96

## 2011-09-15 LAB — CBC
HCT: 34.3 — ABNORMAL LOW
HCT: 35 — ABNORMAL LOW
Hemoglobin: 11.6 — ABNORMAL LOW
MCHC: 32.2
MCHC: 32.7
MCHC: 32.9
MCV: 94.6
MCV: 95.4
Platelets: 199
Platelets: 199
RBC: 3.69 — ABNORMAL LOW
RDW: 14.1
RDW: 14.1
RDW: 14.1
RDW: 14.3

## 2011-09-15 LAB — BASIC METABOLIC PANEL
BUN: 12
GFR calc non Af Amer: >60
Glucose, Bld: 177 — ABNORMAL HIGH
Potassium: 4.1

## 2011-09-15 LAB — DIFFERENTIAL
Basophils Absolute: 0
Basophils Absolute: 0.1
Basophils Relative: 1
Basophils Relative: 1
Lymphocytes Relative: 22
Lymphocytes Relative: 26
Monocytes Absolute: 0.6
Monocytes Absolute: 0.6
Monocytes Relative: 10
Monocytes Relative: 8
Neutro Abs: 3.2
Neutro Abs: 4.5
Neutro Abs: 4.7
Neutrophils Relative %: 58
Neutrophils Relative %: 65
Neutrophils Relative %: 68

## 2011-09-15 LAB — HEPARIN LEVEL (UNFRACTIONATED): Heparin Unfractionated: 0.37

## 2011-09-15 LAB — PROTIME-INR
INR: 1
INR: 1.1
INR: 1.1
INR: 1.1
Prothrombin Time: 13.4
Prothrombin Time: 14
Prothrombin Time: 14.3

## 2011-09-15 LAB — POCT I-STAT, CHEM 8
Calcium, Ion: 1.15
HCT: 40
TCO2: 30

## 2011-09-15 LAB — ANTIPHOSPHOLIPID SYNDROME EVAL, BLD

## 2011-09-15 LAB — COMPREHENSIVE METABOLIC PANEL
Albumin: 3.1 — ABNORMAL LOW
BUN: 11
Calcium: 8.6
Creatinine, Ser: 0.72
Total Protein: 6.3

## 2011-09-15 LAB — APTT: aPTT: 26

## 2011-09-17 ENCOUNTER — Other Ambulatory Visit: Payer: Self-pay | Admitting: Cardiology

## 2011-09-17 LAB — BASIC METABOLIC PANEL
BUN: 10 mg/dL (ref 6–23)
BUN: 17 mg/dL (ref 6–23)
BUN: 4 mg/dL — ABNORMAL LOW (ref 6–23)
BUN: 7 mg/dL (ref 6–23)
CO2: 28 mEq/L (ref 19–32)
CO2: 31 mEq/L (ref 19–32)
CO2: 32 mEq/L (ref 19–32)
CO2: 35 mEq/L — ABNORMAL HIGH (ref 19–32)
Calcium: 7.7 mg/dL — ABNORMAL LOW (ref 8.4–10.5)
Calcium: 8 mg/dL — ABNORMAL LOW (ref 8.4–10.5)
Calcium: 8.2 mg/dL — ABNORMAL LOW (ref 8.4–10.5)
Calcium: 8.3 mg/dL — ABNORMAL LOW (ref 8.4–10.5)
Calcium: 9.2 mg/dL (ref 8.4–10.5)
Chloride: 101 mEq/L (ref 96–112)
Chloride: 102 mEq/L (ref 96–112)
Chloride: 104 mEq/L (ref 96–112)
Chloride: 98 mEq/L (ref 96–112)
Creatinine, Ser: 0.73 mg/dL (ref 0.4–1.5)
Creatinine, Ser: 0.83 mg/dL (ref 0.4–1.5)
Creatinine, Ser: 0.87 mg/dL (ref 0.4–1.5)
Creatinine, Ser: 0.95 mg/dL (ref 0.4–1.5)
Creatinine, Ser: 1.12 mg/dL (ref 0.4–1.5)
GFR calc Af Amer: >60 mL/min (ref >60)
GFR calc Af Amer: >60 mL/min (ref >60)
GFR calc Af Amer: >60 mL/min (ref >60)
GFR calc non Af Amer: >60 mL/min (ref >60)
GFR calc non Af Amer: >60 mL/min (ref >60)
GFR calc non Af Amer: >60 mL/min (ref >60)
Glucose, Bld: 106 mg/dL — ABNORMAL HIGH (ref 70–99)
Glucose, Bld: 110 mg/dL — ABNORMAL HIGH (ref 70–99)
Glucose, Bld: 136 mg/dL — ABNORMAL HIGH (ref 70–99)
Glucose, Bld: 99 mg/dL (ref 70–99)
Potassium: 3.6 mEq/L (ref 3.5–5.1)
Sodium: 136 mEq/L (ref 135–145)
Sodium: 138 mEq/L (ref 135–145)
Sodium: 141 mEq/L (ref 135–145)

## 2011-09-17 LAB — CBC
HCT: 32.9 % — ABNORMAL LOW (ref 39.0–52.0)
Hemoglobin: 10.5 g/dL — ABNORMAL LOW (ref 13.0–17.0)
Hemoglobin: 10.7 g/dL — ABNORMAL LOW (ref 13.0–17.0)
Hemoglobin: 10.9 g/dL — ABNORMAL LOW (ref 13.0–17.0)
MCHC: 31.4 g/dL (ref 30.0–36.0)
MCHC: 31.7 g/dL (ref 30.0–36.0)
MCHC: 32 g/dL (ref 30.0–36.0)
MCHC: 32.1 g/dL (ref 30.0–36.0)
MCV: 86.1 fL (ref 78.0–100.0)
MCV: 86.8 fL (ref 78.0–100.0)
MCV: 87.3 fL (ref 78.0–100.0)
MCV: 87.4 fL (ref 78.0–100.0)
Platelets: 208 10*3/uL (ref 150–400)
Platelets: 244 10*3/uL (ref 150–400)
Platelets: 265 10*3/uL (ref 150–400)
RBC: 3.83 MIL/uL — ABNORMAL LOW (ref 4.22–5.81)
RBC: 3.94 MIL/uL — ABNORMAL LOW (ref 4.22–5.81)
RBC: 3.97 MIL/uL — ABNORMAL LOW (ref 4.22–5.81)
RBC: 4.74 MIL/uL (ref 4.22–5.81)
RDW: 18.8 % — ABNORMAL HIGH (ref 11.5–15.5)
RDW: 18.9 % — ABNORMAL HIGH (ref 11.5–15.5)
RDW: 19.1 % — ABNORMAL HIGH (ref 11.5–15.5)
RDW: 19.5 % — ABNORMAL HIGH (ref 11.5–15.5)
WBC: 11.4 10*3/uL — ABNORMAL HIGH (ref 4.0–10.5)
WBC: 5.8 10*3/uL (ref 4.0–10.5)
WBC: 8 10*3/uL (ref 4.0–10.5)

## 2011-09-17 LAB — PROTIME-INR
INR: 3.3 — ABNORMAL HIGH (ref 0.00–1.49)
Prothrombin Time: 15.2 seconds (ref 11.6–15.2)
Prothrombin Time: 36 seconds — ABNORMAL HIGH (ref 11.6–15.2)

## 2011-09-17 LAB — DIFFERENTIAL
Lymphs Abs: 1 10*3/uL (ref 0.7–4.0)
Monocytes Absolute: 0.8 10*3/uL (ref 0.1–1.0)
Monocytes Relative: 10 % (ref 3–12)
Neutro Abs: 6.2 10*3/uL (ref 1.7–7.7)
Neutrophils Relative %: 78 % — ABNORMAL HIGH (ref 43–77)

## 2011-09-17 LAB — CREATININE, FLUID (PLEURAL, PERITONEAL, JP DRAINAGE): Creat, Fluid: 0.9 mg/dL

## 2011-09-17 LAB — TYPE AND SCREEN: ABO/RH(D): O POS

## 2011-09-17 LAB — ABO/RH: ABO/RH(D): O POS

## 2012-01-10 ENCOUNTER — Other Ambulatory Visit: Payer: Self-pay | Admitting: Cardiology

## 2012-03-16 ENCOUNTER — Other Ambulatory Visit: Payer: Self-pay | Admitting: *Deleted

## 2012-03-16 MED ORDER — METOPROLOL TARTRATE 100 MG PO TABS: 100.0000 mg | ORAL_TABLET | Freq: Two times a day (BID) | ORAL | Status: DC

## 2012-03-20 ENCOUNTER — Telehealth: Payer: Self-pay | Admitting: Cardiology

## 2012-03-20 MED ORDER — METOPROLOL TARTRATE 100 MG PO TABS: 100.0000 mg | ORAL_TABLET | Freq: Two times a day (BID) | ORAL | Status: DC

## 2012-03-20 NOTE — Telephone Encounter (Signed)
New Problem:    Patient called in needing a refill of his metoprolol (LOPRESSOR) 100 MG tablet at [facility] (phone#- [phone])

## 2012-03-29 ENCOUNTER — Ambulatory Visit (INDEPENDENT_AMBULATORY_CARE_PROVIDER_SITE_OTHER): Payer: Medicare Other | Admitting: Cardiology

## 2012-03-29 ENCOUNTER — Encounter: Payer: Self-pay | Admitting: Cardiology

## 2012-03-29 VITALS — BP 160/90 | HR 71 | Ht 66.0 in | Wt 246.0 lb

## 2012-03-29 DIAGNOSIS — I2510 Atherosclerotic heart disease of native coronary artery without angina pectoris: Secondary | ICD-10-CM

## 2012-03-29 DIAGNOSIS — I509 Heart failure, unspecified: Secondary | ICD-10-CM

## 2012-03-29 DIAGNOSIS — E7800 Pure hypercholesterolemia, unspecified: Secondary | ICD-10-CM

## 2012-03-29 DIAGNOSIS — I10 Essential (primary) hypertension: Secondary | ICD-10-CM

## 2012-03-29 DIAGNOSIS — I25810 Atherosclerosis of coronary artery bypass graft(s) without angina pectoris: Secondary | ICD-10-CM

## 2012-03-29 NOTE — Assessment & Plan Note (Signed)
Blood pressure elevated but he states typically normal at home. He will follow this and we will add medications as needed.

## 2012-03-29 NOTE — Patient Instructions (Addendum)
Your physician wants you to follow-up in: ONE YEAR WITH DR CRENSHAW IN [place] You will receive a reminder letter in the mail two months in advance. If you don't receive a letter, please call our office to schedule the follow-up appointment.   Your physician recommends that you HAVE LAB WORK TODAY 

## 2012-03-29 NOTE — Progress Notes (Signed)
HPI: [name] is a pleasant gentleman who has history of coronary artery disease, status post coronary bypassing graft in September 2009.  His LV function is preserved. He was also found to have a DVT on that admission and was placed on Coumadin. There is a strong family history of protein S deficiency. The patient was seen by Dr. Myna Hidalgo and Coumadin for one year was recommended. Note he did have blood drawn after discontinuing Coumadin. He did not have a protein S or protein C deficiency. He also was found to have a renal mass, which was ultimately resected and found to be benign by his report.  I last saw him in Aug 2011. Since then, the patient has dyspnea with activities. It is relieved with rest. It is not associated with chest pain. There is no orthopnea, PND or pedal edema. There is no syncope or palpitations. There is no exertional chest pain.  Current Outpatient Prescriptions  Medication Sig Dispense Refill  . aspirin 81 MG EC tablet Take 81 mg by mouth daily.        . folic acid (FOLVITE) 800 MCG tablet Take 800 mcg by mouth daily.        . furosemide (LASIX) 40 MG tablet TAKE ONE TABLET BY MOUTH IN THE MORNING AND ONE-HALF TABLET AT 2 PM DAILY  60 tablet  12  . KLOR-CON M20 20 MEQ tablet TAKE ONE TABLET BY MOUTH EVERY DAY  30 each  6  . lisinopril (PRINIVIL,ZESTRIL) 20 MG tablet TAKE ONE TABLET BY MOUTH EVERY DAY  30 tablet  11  . metoprolol (LOPRESSOR) 100 MG tablet Take 1 tablet (100 mg total) by mouth 2 (two) times daily.  60 tablet  12  . pravastatin (PRAVACHOL) 40 MG tablet TAKE TWO TABLETS BY MOUTH AT BEDTIME  60 tablet  6  . ranitidine (ZANTAC) 150 MG capsule Take 150 mg by mouth 2 (two) times daily.        Marland Kitchen sulfaSALAzine (AZULFIDINE) 500 MG tablet Take 1,000 mg by mouth 2 (two) times daily.           Past Medical History  Diagnosis Date  . Coronary atherosclerosis of artery bypass graft   . Pure hypercholesterolemia   . Unspecified cerebral artery occlusion with cerebral  infarction   . Regional enteritis of unspecified site     with rectal bleeding-dx 3 years ago  . DVT (deep venous thrombosis)   . Esophageal reflux   . Atrial fibrillation     post CABG  . Left renal mass     history. status post resection with results being benign  . Hypertension   . History of protein S deficiency     family  . COPD (chronic obstructive pulmonary disease)     Past Surgical History  Procedure Date  . Replacement total knee bilateral 11/08 and 6/08  . Coronary artery bypass graft 08/2008    with a LIMA to the LAD, vein graft to the diagonal, saphenous vein graft to the acute marginal and a saphenous vein graft to the PDA.  . Prior resection of a renal mass     History   Social History  . Marital Status: Married    Spouse Name: N/A    Number of Children: N/A  . Years of Education: N/A   Occupational History  . Full Time Upholsterer    Social History Main Topics  . Smoking status: Former Smoker -- 1.0 packs/day for 50 years    Types:  Cigarettes    Quit date: 12/13/2004  . Smokeless tobacco: Current User    Types: Chew   Comment: Chew past 3 years. now resolved  . Alcohol Use: No  . Drug Use: No  . Sexually Active: Not on file   Other Topics Concern  . Not on file   Social History Narrative   Lives in [place]    ROS: no fevers or chills, productive cough, hemoptysis, dysphasia, odynophagia, melena, hematochezia, dysuria, hematuria, rash, seizure activity, orthopnea, PND, pedal edema, claudication. Remaining systems are negative.  Physical Exam: Well-developed well-nourished in no acute distress.  Skin is warm and dry.  HEENT is normal.  Neck is supple. No thyromegaly.  Chest is diminished breath sounds throughout; previous sternotomy Cardiovascular exam is regular rate and rhythm.  Abdominal exam nontender or distended. No masses palpated. Umbilical hernia Extremities show no edema. neuro grossly intact  ECG normal sinus rhythm at a rate of  71. No ST changes.

## 2012-03-29 NOTE — Assessment & Plan Note (Signed)
Continue statin. Check lipids and liver. 

## 2012-03-29 NOTE — Assessment & Plan Note (Signed)
Continue aspirin and statin. 

## 2012-03-29 NOTE — Assessment & Plan Note (Signed)
History of diastolic congestive heart failure. Continue present dose of Lasix. Euvolemic on examination. Check potassium and renal function.

## 2012-03-30 LAB — BASIC METABOLIC PANEL WITH GFR
BUN: 17 mg/dL (ref 6–23)
Chloride: 97 mEq/L (ref 96–112)
Glucose, Bld: 258 mg/dL — ABNORMAL HIGH (ref 70–99)
Potassium: 4.6 mEq/L (ref 3.5–5.3)

## 2012-03-30 LAB — HEPATIC FUNCTION PANEL
Bilirubin, Direct: 0.1 mg/dL (ref 0.0–0.3)
Indirect Bilirubin: 0.5 mg/dL (ref 0.0–0.9)

## 2012-04-14 ENCOUNTER — Other Ambulatory Visit: Payer: Self-pay | Admitting: Cardiology

## 2012-04-20 ENCOUNTER — Other Ambulatory Visit: Payer: Self-pay | Admitting: Cardiology

## 2012-05-13 ENCOUNTER — Other Ambulatory Visit: Payer: Self-pay | Admitting: Cardiology

## 2012-05-23 ENCOUNTER — Other Ambulatory Visit: Payer: Self-pay | Admitting: *Deleted

## 2012-05-23 MED ORDER — LISINOPRIL 20 MG PO TABS: 20.0000 mg | ORAL_TABLET | Freq: Every day | ORAL | Status: DC

## 2012-11-12 ENCOUNTER — Other Ambulatory Visit: Payer: Self-pay | Admitting: Cardiology

## 2012-11-13 ENCOUNTER — Other Ambulatory Visit: Payer: Self-pay | Admitting: Cardiology

## 2012-11-13 MED ORDER — FUROSEMIDE 40 MG PO TABS: ORAL_TABLET | ORAL | Status: DC

## 2013-01-22 ENCOUNTER — Other Ambulatory Visit: Payer: Self-pay | Admitting: Cardiology

## 2013-03-26 ENCOUNTER — Other Ambulatory Visit: Payer: Self-pay | Admitting: Cardiology

## 2013-04-23 ENCOUNTER — Other Ambulatory Visit: Payer: Self-pay | Admitting: Cardiology

## 2013-05-14 ENCOUNTER — Other Ambulatory Visit: Payer: Self-pay | Admitting: Cardiology

## 2013-05-16 ENCOUNTER — Encounter: Payer: Self-pay | Admitting: Cardiology

## 2013-05-16 ENCOUNTER — Ambulatory Visit (INDEPENDENT_AMBULATORY_CARE_PROVIDER_SITE_OTHER): Payer: Medicare Other | Admitting: Cardiology

## 2013-05-16 VITALS — BP 130/82 | HR 66 | Ht 66.0 in | Wt 237.0 lb

## 2013-05-16 DIAGNOSIS — I10 Essential (primary) hypertension: Secondary | ICD-10-CM

## 2013-05-16 DIAGNOSIS — E7800 Pure hypercholesterolemia, unspecified: Secondary | ICD-10-CM

## 2013-05-16 DIAGNOSIS — I509 Heart failure, unspecified: Secondary | ICD-10-CM

## 2013-05-16 DIAGNOSIS — I25810 Atherosclerosis of coronary artery bypass graft(s) without angina pectoris: Secondary | ICD-10-CM

## 2013-05-16 LAB — HEPATIC FUNCTION PANEL
AST: 16 U/L (ref 0–37)
Alkaline Phosphatase: 72 U/L (ref 39–117)
Bilirubin, Direct: 0.1 mg/dL (ref 0.0–0.3)
Indirect Bilirubin: 0.3 mg/dL (ref 0.0–0.9)
Total Bilirubin: 0.4 mg/dL (ref 0.3–1.2)

## 2013-05-16 LAB — LIPID PANEL
HDL: 40 mg/dL (ref >39)
LDL Cholesterol: 96 mg/dL (ref 0–99)

## 2013-05-16 NOTE — Assessment & Plan Note (Signed)
euvolemic on examination. Continue present dose of diuretic. Check potassium and renal function. 

## 2013-05-16 NOTE — Assessment & Plan Note (Signed)
Continue aspirin and statin.plan functional study when he returns in one-year period

## 2013-05-16 NOTE — Progress Notes (Signed)
HPI: [name] is a pleasant gentleman who has history of coronary artery disease, status post coronary bypassing graft in September 2009. His LV function is preserved. He was also found to have a DVT on that admission and was placed on Coumadin. There is a strong family history of protein S deficiency. The patient was seen by Dr. Myna Hidalgo and Coumadin for one year was recommended. Note he did have blood drawn after discontinuing Coumadin. He did not have a protein S or protein C deficiency. He also was found to have a renal mass, which was ultimately resected and found to be benign by his report. I last saw him in April of 2013. Since then, the patient has dyspnea with activities. It is relieved with rest. It is not associated with chest pain. There is no orthopnea, PND or pedal edema. There is no syncope or palpitations. There is no exertional chest pain.   Current Outpatient Prescriptions  Medication Sig Dispense Refill  . aspirin 81 MG EC tablet Take 81 mg by mouth daily.        . folic acid (FOLVITE) 800 MCG tablet Take 800 mcg by mouth daily.        . furosemide (LASIX) 40 MG tablet TAKE 1 TABLET BY MOUTH EVERY MORNING AND TAKE 1/2 TABLET BY MOUTH DAILY AT 2 PM  60 tablet  0  . lisinopril (PRINIVIL,ZESTRIL) 20 MG tablet TAKE 1 TABLET BY MOUTH DAILY  30 tablet  0  . metFORMIN (GLUCOPHAGE) 1000 MG tablet Take 1 tablet by mouth 2 (two) times daily.      . metoprolol (LOPRESSOR) 100 MG tablet TAKE 1 TABLET BY MOUTH TWICE DAILY  60 tablet  0  . potassium chloride SA (K-DUR,KLOR-CON) 20 MEQ tablet TAKE ONE TABLET BY MOUTH EVERY DAY  30 tablet  11  . pravastatin (PRAVACHOL) 40 MG tablet TAKE 2 TABLETS BY MOUTH EVERY NIGHT AT BEDTIME  60 tablet  11  . ranitidine (ZANTAC) 150 MG capsule Take 150 mg by mouth 2 (two) times daily.        Marland Kitchen sulfaSALAzine (AZULFIDINE) 500 MG tablet Take 1,000 mg by mouth 2 (two) times daily.         No current facility-administered medications for this visit.     Past  Medical History  Diagnosis Date  . Coronary atherosclerosis of artery bypass graft   . Pure hypercholesterolemia   . Unspecified cerebral artery occlusion with cerebral infarction   . Regional enteritis of unspecified site     with rectal bleeding-dx 3 years ago  . DVT (deep venous thrombosis)   . Esophageal reflux   . Atrial fibrillation     post CABG  . Left renal mass     history. status post resection with results being benign  . Hypertension   . History of protein S deficiency     family  . COPD (chronic obstructive pulmonary disease)     Past Surgical History  Procedure Laterality Date  . Replacement total knee bilateral  11/08 and 6/08  . Coronary artery bypass graft  08/2008    with a LIMA to the LAD, vein graft to the diagonal, saphenous vein graft to the acute marginal and a saphenous vein graft to the PDA.  . Prior resection of a renal mass      History   Social History  . Marital Status: Married    Spouse Name: N/A    Number of Children: N/A  . Years of Education:  N/A   Occupational History  . Full Time Upholsterer    Social History Main Topics  . Smoking status: Former Smoker -- 1.00 packs/day for 50 years    Types: Cigarettes    Quit date: 12/13/2004  . Smokeless tobacco: Current User    Types: Chew     Comment: Chew past 3 years. now resolved  . Alcohol Use: No  . Drug Use: No  . Sexually Active: Not on file   Other Topics Concern  . Not on file   Social History Narrative   Lives in [place]    ROS: no fevers or chills, productive cough, hemoptysis, dysphasia, odynophagia, melena, hematochezia, dysuria, hematuria, rash, seizure activity, orthopnea, PND, pedal edema, claudication. Remaining systems are negative.  Physical Exam: Well-developed well-nourished in no acute distress.  Skin is warm and dry.  HEENT is normal.  Neck is supple.  Chest diminished breath sounds throughout Cardiovascular exam is regular rate and rhythm.  Abdominal exam  nontender or distended. No masses palpated. Abdominal hernia Extremities show no edema. neuro grossly intact  ECG sinus rhythm at a rate of 66. No ST changes.

## 2013-05-16 NOTE — Patient Instructions (Addendum)
Your physician wants you to follow-up in: ONE YEAR WITH DR CRENSHAW You will receive a reminder letter in the mail two months in advance. If you don't receive a letter, please call our office to schedule the follow-up appointment.   Your physician recommends that you HAVE LAB WORK TODAY 

## 2013-05-16 NOTE — Assessment & Plan Note (Signed)
Blood pressure controlled. Continue present medications. 

## 2013-05-16 NOTE — Assessment & Plan Note (Signed)
Continue statin. Check lipids and liver. 

## 2013-05-17 LAB — BASIC METABOLIC PANEL WITH GFR
BUN: 22 mg/dL (ref 6–23)
CO2: 31 mEq/L (ref 19–32)
Calcium: 9.3 mg/dL (ref 8.4–10.5)
Chloride: 96 mEq/L (ref 96–112)
Creat: 1.01 mg/dL (ref 0.50–1.35)
Glucose, Bld: 126 mg/dL — ABNORMAL HIGH (ref 70–99)

## 2013-05-21 ENCOUNTER — Other Ambulatory Visit: Payer: Self-pay | Admitting: Cardiology

## 2013-05-28 ENCOUNTER — Other Ambulatory Visit: Payer: Self-pay | Admitting: Cardiology

## 2013-06-04 ENCOUNTER — Telehealth: Payer: Self-pay | Admitting: Cardiology

## 2013-06-04 MED ORDER — ATORVASTATIN CALCIUM 80 MG PO TABS: 80.0000 mg | ORAL_TABLET | Freq: Every day | ORAL | Status: DC

## 2013-06-04 NOTE — Telephone Encounter (Signed)
New problem  Pt is wanting the generic drug of Lipitor called into pharmacy. He said he spoke with the doctor about this last week.

## 2013-06-04 NOTE — Telephone Encounter (Signed)
Spoke with pt, lipitor sent to pharm. He will have labs checked in 6 weeks at his PCP. It is cheaper for him there. He will send [place] a copy.

## 2013-06-15 ENCOUNTER — Other Ambulatory Visit: Payer: Self-pay | Admitting: Cardiology

## 2013-06-25 ENCOUNTER — Other Ambulatory Visit: Payer: Self-pay | Admitting: Cardiology

## 2013-06-28 ENCOUNTER — Other Ambulatory Visit: Payer: Self-pay | Admitting: *Deleted

## 2013-06-28 MED ORDER — METOPROLOL TARTRATE 100 MG PO TABS: ORAL_TABLET | ORAL | Status: DC

## 2013-07-15 ENCOUNTER — Other Ambulatory Visit: Payer: Self-pay | Admitting: Cardiology

## 2013-07-18 ENCOUNTER — Other Ambulatory Visit: Payer: Self-pay

## 2013-07-21 ENCOUNTER — Other Ambulatory Visit: Payer: Self-pay | Admitting: Cardiology

## 2013-08-09 ENCOUNTER — Other Ambulatory Visit: Payer: Self-pay | Admitting: Cardiology

## 2013-08-28 ENCOUNTER — Other Ambulatory Visit: Payer: Self-pay | Admitting: Cardiology

## 2013-10-18 ENCOUNTER — Other Ambulatory Visit: Payer: Self-pay

## 2014-01-27 ENCOUNTER — Other Ambulatory Visit: Payer: Self-pay | Admitting: Cardiology

## 2014-02-15 ENCOUNTER — Other Ambulatory Visit: Payer: Self-pay | Admitting: Cardiovascular Disease

## 2014-05-27 ENCOUNTER — Other Ambulatory Visit: Payer: Self-pay | Admitting: Cardiology

## 2014-06-05 ENCOUNTER — Other Ambulatory Visit: Payer: Self-pay | Admitting: Cardiology

## 2014-06-07 ENCOUNTER — Other Ambulatory Visit: Payer: Self-pay | Admitting: Cardiology

## 2014-06-25 ENCOUNTER — Other Ambulatory Visit: Payer: Self-pay | Admitting: Cardiology

## 2014-08-01 ENCOUNTER — Other Ambulatory Visit: Payer: Self-pay

## 2014-08-01 MED ORDER — METOPROLOL TARTRATE 100 MG PO TABS: ORAL_TABLET | ORAL | Status: DC

## 2014-08-02 ENCOUNTER — Other Ambulatory Visit: Payer: Self-pay | Admitting: *Deleted

## 2014-08-02 MED ORDER — METOPROLOL TARTRATE 100 MG PO TABS: ORAL_TABLET | ORAL | Status: DC

## 2014-08-06 ENCOUNTER — Other Ambulatory Visit: Payer: Self-pay | Admitting: Cardiology

## 2014-09-18 ENCOUNTER — Encounter: Payer: Self-pay | Admitting: Cardiology

## 2014-09-18 ENCOUNTER — Ambulatory Visit (INDEPENDENT_AMBULATORY_CARE_PROVIDER_SITE_OTHER): Payer: Medicare Other | Admitting: Cardiology

## 2014-09-18 VITALS — BP 134/86 | HR 85 | Ht 66.0 in | Wt 228.0 lb

## 2014-09-18 DIAGNOSIS — I5032 Chronic diastolic (congestive) heart failure: Secondary | ICD-10-CM

## 2014-09-18 DIAGNOSIS — I10 Essential (primary) hypertension: Secondary | ICD-10-CM

## 2014-09-18 DIAGNOSIS — I4891 Unspecified atrial fibrillation: Secondary | ICD-10-CM

## 2014-09-18 DIAGNOSIS — E7800 Pure hypercholesterolemia, unspecified: Secondary | ICD-10-CM

## 2014-09-18 DIAGNOSIS — I25700 Atherosclerosis of coronary artery bypass graft(s), unspecified, with unstable angina pectoris: Secondary | ICD-10-CM

## 2014-09-18 NOTE — Patient Instructions (Signed)
Your physician wants you to follow-up in: ONE YEAR WITH DR Shelda PalRENSHAW You will receive a reminder letter in the mail two months in advance. If you don't receive a letter, please call our office to schedule the follow-up appointment.   Your physician has requested that you have a lexiscan myoview. For further information please visit [email or web address]. Please follow instruction sheet, as given.CALL TO SCHEDULE IN FEB

## 2014-09-18 NOTE — Assessment & Plan Note (Signed)
Blood pressure controlled. Continue present medications. 

## 2014-09-18 NOTE — Assessment & Plan Note (Signed)
Continue aspirin and statin.Patient has some dyspnea on exertion. Schedule nuclear study for risk stratification.

## 2014-09-18 NOTE — Progress Notes (Signed)
HPI: FU coronary artery disease, status post coronary bypassing graft in September 2009. His LV function is preserved. He was also found to have a DVT on that admission and was placed on Coumadin. There is a strong family history of protein S deficiency. The patient was seen by Dr. Myna HidalgoEnnever and Coumadin for one year was recommended. Note he did have blood drawn after discontinuing Coumadin. He did not have a protein S or protein C deficiency. He also was found to have a renal mass, which was ultimately resected and found to be benign by his report. Since I last saw him, He has dyspnea on exertion unchanged. No orthopnea, PND, pedal edema, syncope or chest pain.   Current Outpatient Prescriptions  Medication Sig Dispense Refill  . aspirin 81 MG EC tablet Take 81 mg by mouth daily.        Marland Kitchen. atorvastatin (LIPITOR) 80 MG tablet TAKE ONE TABLET BY MOUTH DAILY      . folic acid (FOLVITE) 800 MCG tablet Take 800 mcg by mouth daily.        . furosemide (LASIX) 40 MG tablet TAKE 1 TABLET BY MOUTH EVERY MORNING AND 1/2 TABLET DAILY AT 2 PM  60 tablet  6  . lisinopril (PRINIVIL,ZESTRIL) 20 MG tablet TAKE 1 TABLET BY MOUTH EVERY DAY  30 tablet  6  . metFORMIN (GLUCOPHAGE) 1000 MG tablet Take 1 tablet by mouth 2 (two) times daily.      . metoprolol (LOPRESSOR) 100 MG tablet TAKE 1 TABLET BY MOUTH TWICE DAILY  60 tablet  1  . potassium chloride SA (K-DUR,KLOR-CON) 20 MEQ tablet TAKE 1 TABLET BY MOUTH EVERY DAY  30 tablet  1  . ranitidine (ZANTAC) 150 MG capsule Take 150 mg by mouth 2 (two) times daily.        Marland Kitchen. sulfaSALAzine (AZULFIDINE) 500 MG tablet Take 1,000 mg by mouth 2 (two) times daily.         No current facility-administered medications for this visit.     Past Medical History  Diagnosis Date  . Coronary atherosclerosis of artery bypass graft   . Pure hypercholesterolemia   . Unspecified cerebral artery occlusion with cerebral infarction   . Regional enteritis of unspecified site    with rectal bleeding-dx 3 years ago  . DVT (deep venous thrombosis)   . Esophageal reflux   . Atrial fibrillation     post CABG  . Left renal mass     history. status post resection with results being benign  . Hypertension   . History of protein S deficiency     family  . COPD (chronic obstructive pulmonary disease)     Past Surgical History  Procedure Laterality Date  . Replacement total knee bilateral  11/08 and 6/08  . Coronary artery bypass graft  08/2008    with a LIMA to the LAD, vein graft to the diagonal, saphenous vein graft to the acute marginal and a saphenous vein graft to the PDA.  . Prior resection of a renal mass      History   Social History  . Marital Status: Married    Spouse Name: N/A    Number of Children: N/A  . Years of Education: N/A   Occupational History  . Full Time Upholsterer    Social History Main Topics  . Smoking status: Former Smoker -- 1.00 packs/day for 50 years    Types: Cigarettes    Quit date: 12/13/2004  .  Smokeless tobacco: Current User    Types: Chew     Comment: Chew past 3 years. now resolved  . Alcohol Use: No  . Drug Use: No  . Sexual Activity: Not on file   Other Topics Concern  . Not on file   Social History Narrative   Lives in [place]    ROS: no fevers or chills, productive cough, hemoptysis, dysphasia, odynophagia, melena, hematochezia, dysuria, hematuria, rash, seizure activity, orthopnea, PND, pedal edema, claudication. Remaining systems are negative.  Physical Exam: Well-developed well-nourished in no acute distress.  Skin is warm and dry.  HEENT is normal.  Neck is supple.  Chest With diminished breath sounds Cardiovascular exam is regular rate and rhythm.  Abdominal exam nontender or distended. No masses palpated. Extremities show no edema. neuro grossly intact  ECG Sinus rhythm at a rate of 85. No ST changes.

## 2014-09-18 NOTE — Assessment & Plan Note (Signed)
Patient with history of diastolic congestive heart failure. Continue present dose of Lasix. I will have his most recent potassium and renal function sent to us from primary care.

## 2014-09-18 NOTE — Assessment & Plan Note (Signed)
Continue statin. 

## 2014-09-19 ENCOUNTER — Other Ambulatory Visit: Payer: Self-pay | Admitting: Cardiovascular Disease

## 2014-09-19 ENCOUNTER — Encounter: Payer: Self-pay | Admitting: Cardiology

## 2014-10-07 ENCOUNTER — Other Ambulatory Visit: Payer: Self-pay | Admitting: Cardiology

## 2014-10-10 ENCOUNTER — Other Ambulatory Visit: Payer: Self-pay | Admitting: Cardiology

## 2014-10-11 ENCOUNTER — Other Ambulatory Visit: Payer: Self-pay | Admitting: *Deleted

## 2014-10-11 MED ORDER — POTASSIUM CHLORIDE CRYS ER 20 MEQ PO TBCR: EXTENDED_RELEASE_TABLET | ORAL | Status: DC

## 2014-10-11 MED ORDER — ATORVASTATIN CALCIUM 80 MG PO TABS: ORAL_TABLET | ORAL | Status: DC

## 2014-11-22 ENCOUNTER — Other Ambulatory Visit: Payer: Self-pay | Admitting: *Deleted

## 2014-11-22 DIAGNOSIS — I25700 Atherosclerosis of coronary artery bypass graft(s), unspecified, with unstable angina pectoris: Secondary | ICD-10-CM

## 2014-11-26 ENCOUNTER — Telehealth (HOSPITAL_COMMUNITY): Payer: Self-pay | Admitting: *Deleted

## 2014-12-25 ENCOUNTER — Other Ambulatory Visit: Payer: Self-pay | Admitting: *Deleted

## 2014-12-25 ENCOUNTER — Other Ambulatory Visit: Payer: Self-pay

## 2014-12-25 MED ORDER — LISINOPRIL 20 MG PO TABS: 20.0000 mg | ORAL_TABLET | Freq: Every day | ORAL | Status: DC

## 2014-12-25 MED ORDER — METOPROLOL TARTRATE 100 MG PO TABS: 100.0000 mg | ORAL_TABLET | Freq: Two times a day (BID) | ORAL | Status: DC

## 2014-12-25 MED ORDER — POTASSIUM CHLORIDE CRYS ER 20 MEQ PO TBCR: EXTENDED_RELEASE_TABLET | ORAL | Status: DC

## 2014-12-25 MED ORDER — FUROSEMIDE 40 MG PO TABS: ORAL_TABLET | ORAL | Status: DC

## 2014-12-25 MED ORDER — ATORVASTATIN CALCIUM 80 MG PO TABS: ORAL_TABLET | ORAL | Status: DC

## 2015-01-20 ENCOUNTER — Telehealth (HOSPITAL_COMMUNITY): Payer: Self-pay | Admitting: *Deleted

## 2015-01-24 ENCOUNTER — Telehealth (HOSPITAL_COMMUNITY): Payer: Self-pay

## 2015-01-24 NOTE — Telephone Encounter (Signed)
Encounter complete. 

## 2015-01-29 ENCOUNTER — Ambulatory Visit (HOSPITAL_COMMUNITY)
Admission: RE | Admit: 2015-01-29 | Discharge: 2015-01-29 | Disposition: A | Discharge status: Home / Self Care | Payer: Medicare Other | Source: Ambulatory Visit | Attending: Cardiology | Admitting: Cardiology

## 2015-01-29 DIAGNOSIS — I25700 Atherosclerosis of coronary artery bypass graft(s), unspecified, with unstable angina pectoris: Secondary | ICD-10-CM | POA: Diagnosis present

## 2015-01-29 MED ORDER — AMINOPHYLLINE 25 MG/ML IV SOLN
75.0000 mg | Freq: Once | INTRAVENOUS | Status: AC
  Administered 2015-01-29: 75 mg via INTRAVENOUS

## 2015-01-29 MED ORDER — REGADENOSON 0.4 MG/5ML IV SOLN
0.4000 mg | Freq: Once | INTRAVENOUS | Status: AC
  Administered 2015-01-29: 0.4 mg via INTRAVENOUS

## 2015-01-29 MED ORDER — TECHNETIUM TC 99M SESTAMIBI GENERIC - CARDIOLITE
10.9000 | Freq: Once | INTRAVENOUS | Status: AC | PRN
  Administered 2015-01-29: 10.9 via INTRAVENOUS

## 2015-01-29 MED ORDER — TECHNETIUM TC 99M SESTAMIBI GENERIC - CARDIOLITE
30.6000 | Freq: Once | INTRAVENOUS | Status: AC | PRN
  Administered 2015-01-29: 31 via INTRAVENOUS

## 2015-01-29 NOTE — Procedures (Addendum)
[facility] [address] [place] [id number] [phone]  Cardiology Nuclear Med Study  [name] is a 69 y.o. male     MRN : [id number]     DOB: 03-21-1946  Procedure Date: 01/29/2015  Nuclear Med Background Indication for Stress Test:  Follow up CAD History:  COPD and CAD;CHF;CABG x4-08/2008;PAF; Cardiac Risk Factors: CVA, Family History - CAD, History of Smoking, Hypertension, Lipids, Obesity and Left Renal Mass;Pt does use smokless tobacco  Symptoms:  Dizziness, DOE, Fatigue and Light-Headedness   Nuclear Pre-Procedure Caffeine/Decaff Intake:  9:00pm NPO After: 7:00am   IV Site: R Forearm  IV 0.9% NS with Angio Cath:  22g  Chest Size (in):  48" IV Started by: Berdie OgrenAmanda Wease, RN  Height: 5\' 6"  (1.676 m)  Cup Size: n/a  BMI:  Body mass index is 36.82 kg/(m^2). Weight:  228 lb (103.42 kg)   Tech Comments:  n/a    Nuclear Med Study 1 or 2 day study: 1 day  Stress Test Type:  Lexiscan  Order Authorizing Provider:  Olga MillersBrian Crenshaw, MD   Resting Radionuclide: Technetium 2212m Sestamibi  Resting Radionuclide Dose: 10.9 mCi   Stress Radionuclide:  Technetium 6112m Sestamibi  Stress Radionuclide Dose: 30.6 mCi           Stress Protocol Rest HR: 70 Stress HR: 89  Rest BP: 116/72 Stress BP: 117/73  Exercise Time (min): n/a METS: n/a          Dose of Adenosine (mg):  n/a Dose of Lexiscan: 0.4 mg  Dose of Atropine (mg): n/a Dose of Dobutamine: n/a mcg/kg/min (at max HR)  Stress Test Technologist: Ernestene MentionGwen Farrington, CCT Nuclear Technologist: Gonzella LexPam Phillips, CNMT   Rest Procedure:  Myocardial perfusion imaging was performed at rest 45 minutes following the intravenous administration of Technetium 3412m Sestamibi. Stress Procedure:  The patient received IV Lexiscan 0.4 mg over 15-seconds.  Technetium 7012m Sestamibi injected IV at 30-seconds.  Patient experienced shortness of breath, a drop in blood pressure and dizziness. He was  administered 75 mg of Aminophylline IV at 5 minutes. There were no significant changes with Lexiscan.  Quantitative spect images were obtained after a 45 minute delay.  Transient Ischemic Dilatation (Normal <1.22):  1.21  QGS EDV:  96 ml QGS ESV:  45 ml LV Ejection Fraction: 54%       Rest ECG: NSR - Normal EKG  Stress ECG: No significant change from baseline ECG and There are scattered PVCs.  QPS Raw Data Images:  Mild diaphragmatic attenuation.  Normal left ventricular size. Stress Images:  There is decreased uptake in the inferior wall. Rest Images:  Comparison with the stress images reveals no significant change. Subtraction (SDS):  There is a fixed inferior defect that is most consistent with diaphragmatic attenuation. LV Wall Motion:  NL LV Function; NL Wall Motion  Impression Exercise Capacity:  Lexiscan with no exercise. BP Response:  Normal blood pressure response. Clinical Symptoms:  No significant symptoms noted. ECG Impression:  No significant ECG changes with Lexiscan. Comparison with Prior Nuclear Study: No previous nuclear study performed   Overall Impression:  Low risk stress nuclear study with probable diaphragmatic attenuation artifact. Cannot entirely exclude a non-transmural inferior wall infarction. No reversible ischemia is seen.Thurmon Fair.   Deanna Boehlke, MD  01/29/2015 3:58 PM

## 2015-03-07 ENCOUNTER — Other Ambulatory Visit: Payer: Self-pay | Admitting: Cardiology

## 2015-03-07 MED ORDER — POTASSIUM CHLORIDE CRYS ER 20 MEQ PO TBCR: EXTENDED_RELEASE_TABLET | ORAL | Status: DC

## 2015-03-07 NOTE — Telephone Encounter (Signed)
°  1. Which medications need to be refilled? Potassium  2. Which pharmacy is medication to be sent to?[facility]  3. Do they need a 30 day or 90 day supply? 90 and refills  4. Would they like a call back once the medication has been sent to the pharmacy? yes

## 2015-03-07 NOTE — Telephone Encounter (Signed)
Medication sent to pharmacy. Pt made aware 

## 2015-04-25 ENCOUNTER — Other Ambulatory Visit: Payer: Self-pay | Admitting: Cardiology

## 2015-10-05 ENCOUNTER — Other Ambulatory Visit: Payer: Self-pay | Admitting: Cardiology

## 2015-11-23 ENCOUNTER — Other Ambulatory Visit: Payer: Self-pay | Admitting: Cardiology

## 2015-11-24 ENCOUNTER — Other Ambulatory Visit: Payer: Self-pay | Admitting: Cardiology

## 2016-01-02 ENCOUNTER — Other Ambulatory Visit: Payer: Self-pay | Admitting: Cardiology

## 2016-01-02 MED ORDER — LISINOPRIL 20 MG PO TABS: ORAL_TABLET | ORAL | Status: DC

## 2016-01-23 ENCOUNTER — Other Ambulatory Visit: Payer: Self-pay | Admitting: Cardiology

## 2016-01-28 ENCOUNTER — Ambulatory Visit: Payer: Medicare Other | Admitting: Cardiology

## 2016-02-04 ENCOUNTER — Ambulatory Visit (INDEPENDENT_AMBULATORY_CARE_PROVIDER_SITE_OTHER): Payer: Medicare Other | Admitting: Cardiology

## 2016-02-04 ENCOUNTER — Encounter: Payer: Self-pay | Admitting: Cardiology

## 2016-02-04 VITALS — BP 175/93 | HR 84 | Ht 66.0 in | Wt 234.1 lb

## 2016-02-04 DIAGNOSIS — R0600 Dyspnea, unspecified: Secondary | ICD-10-CM

## 2016-02-04 DIAGNOSIS — I10 Essential (primary) hypertension: Secondary | ICD-10-CM

## 2016-02-04 DIAGNOSIS — E7800 Pure hypercholesterolemia, unspecified: Secondary | ICD-10-CM | POA: Diagnosis not present

## 2016-02-04 DIAGNOSIS — R0989 Other specified symptoms and signs involving the circulatory and respiratory systems: Secondary | ICD-10-CM | POA: Diagnosis not present

## 2016-02-04 DIAGNOSIS — I25810 Atherosclerosis of coronary artery bypass graft(s) without angina pectoris: Secondary | ICD-10-CM | POA: Diagnosis not present

## 2016-02-04 MED ORDER — FUROSEMIDE 40 MG PO TABS: ORAL_TABLET | ORAL | Status: DC

## 2016-02-04 MED ORDER — ATORVASTATIN CALCIUM 80 MG PO TABS: ORAL_TABLET | ORAL | Status: DC

## 2016-02-04 MED ORDER — METOPROLOL TARTRATE 100 MG PO TABS: ORAL_TABLET | ORAL | Status: DC

## 2016-02-04 MED ORDER — POTASSIUM CHLORIDE CRYS ER 20 MEQ PO TBCR: EXTENDED_RELEASE_TABLET | ORAL | Status: DC

## 2016-02-04 MED ORDER — LISINOPRIL 20 MG PO TABS: ORAL_TABLET | ORAL | Status: DC

## 2016-02-04 NOTE — Assessment & Plan Note (Signed)
Continue aspirin and statin. 

## 2016-02-04 NOTE — Assessment & Plan Note (Signed)
Continue present dose of Lasix.Check potassium, renal function and BNP. 

## 2016-02-04 NOTE — Patient Instructions (Signed)
Medication Instructions:   NO CHANGE  Labwork:  Your physician recommends that you return for lab work WHEN FASTING  Testing/Procedures:  Your physician has requested that you have a carotid duplex. This test is an ultrasound of the carotid arteries in your neck. It looks at blood flow through these arteries that supply the brain with blood. Allow one hour for this exam. There are no restrictions or special instructions.    Follow-Up:  Your physician wants you to follow-up in: ONE YEAR WITH DR Shelda Pal will receive a reminder letter in the mail two months in advance. If you don't receive a letter, please call our office to schedule the follow-up appointment.   If you need a refill on your cardiac medications before your next appointment, please call your pharmacy.

## 2016-02-04 NOTE — Assessment & Plan Note (Signed)
Schedule carotid Dopplers to further assess. 

## 2016-02-04 NOTE — Assessment & Plan Note (Signed)
Likely multifactorial including COPD, Diastolic congestive heart failure and deconditioning. Check BNP.

## 2016-02-04 NOTE — Assessment & Plan Note (Signed)
Blood pressure mildly elevated today but he has not taken his medications this morning. Typically controlled. We will follow and adjust regimen as needed. Check potassium and renal function.

## 2016-02-04 NOTE — Assessment & Plan Note (Signed)
Continue statin. Check lipids and liver. 

## 2016-02-04 NOTE — Progress Notes (Signed)
HPI: FU coronary artery disease, status post coronary bypassing graft in September 2009. His LV function is preserved. He was also found to have a DVT on that admission and was placed on Coumadin. There is a strong family history of protein S deficiency. The patient was seen by Dr. Myna Hidalgo and Coumadin for one year was recommended. Note he did have blood drawn after discontinuing Coumadin. He did not have a protein S or protein C deficiency. He also was found to have a renal mass, which was ultimately resected and found to be benign by his report. Nuclear study February 2016 showed ejection fraction 54%. There was diaphragmatic attenuation but no ischemia. Since I last saw him, He notes some dyspnea on exertion but no orthopnea, PND, pedal edema, chest pain, palpitations or syncope.  Current Outpatient Prescriptions  Medication Sig Dispense Refill  . aspirin 81 MG EC tablet Take 81 mg by mouth daily.      Marland Kitchen atorvastatin (LIPITOR) 80 MG tablet Take 1 tablet by mouth  daily 90 tablet 1  . folic acid (FOLVITE) 800 MCG tablet Take 800 mcg by mouth daily.      . furosemide (LASIX) 40 MG tablet TAKE 1 TABLET BY MOUTH EVERY MORNING AND 1/2 TABLETS BY MOUTH EVERY DAY AT 2PM 135 tablet 2  . lisinopril (PRINIVIL,ZESTRIL) 20 MG tablet Take 1 tablet by mouth  daily 30 tablet 2  . metFORMIN (GLUCOPHAGE) 1000 MG tablet Take 1 tablet by mouth 2 (two) times daily.    . metoprolol (LOPRESSOR) 100 MG tablet Take 1 tablet by mouth two  times daily 180 tablet 1  . potassium chloride SA (K-DUR,KLOR-CON) 20 MEQ tablet Take 1 tablet by mouth  every day 90 tablet 0  . ranitidine (ZANTAC) 150 MG capsule Take 150 mg by mouth 2 (two) times daily.      Marland Kitchen sulfaSALAzine (AZULFIDINE) 500 MG tablet Take 1,000 mg by mouth 2 (two) times daily.       No current facility-administered medications for this visit.     Past Medical History  Diagnosis Date  . Coronary atherosclerosis of artery bypass graft   . Pure  hypercholesterolemia   . Unspecified cerebral artery occlusion with cerebral infarction   . Regional enteritis of unspecified site     with rectal bleeding-dx 3 years ago  . DVT (deep venous thrombosis) (HCC)   . Esophageal reflux   . Atrial fibrillation (HCC)     post CABG  . Left renal mass     history. status post resection with results being benign  . Hypertension   . History of protein S deficiency     family  . COPD (chronic obstructive pulmonary disease) [facility])     Past Surgical History  Procedure Laterality Date  . Replacement total knee bilateral  11/08 and 6/08  . Coronary artery bypass graft  08/2008    with a LIMA to the LAD, vein graft to the diagonal, saphenous vein graft to the acute marginal and a saphenous vein graft to the PDA.  . Prior resection of a renal mass      Social History   Social History  . Marital Status: Married    Spouse Name: N/A  . Number of Children: N/A  . Years of Education: N/A   Occupational History  . Full Time Upholsterer    Social History Main Topics  . Smoking status: Former Smoker -- 1.00 packs/day for 50 years    Types: Cigarettes  Quit date: 12/13/2004  . Smokeless tobacco: Current User    Types: Chew     Comment: Chew past 3 years. now resolved  . Alcohol Use: No  . Drug Use: No  . Sexual Activity: Not on file   Other Topics Concern  . Not on file   Social History Narrative   Lives in [place]    Family History  Problem Relation Age of Onset  . Protein S deficiency Mother     died of CVA    ROS: no fevers or chills, productive cough, hemoptysis, dysphasia, odynophagia, melena, hematochezia, dysuria, hematuria, rash, seizure activity, orthopnea, PND, pedal edema, claudication. Remaining systems are negative.  Physical Exam: Well-developed well-nourished in no acute distress.  Skin is warm and dry.  HEENT is normal.  Neck is supple. Right carotid bruit Chest Diminished breath sounds throughout Cardiovascular  exam is regular rate and rhythm.  Abdominal exam nontender or distended. No masses palpated. Umbilical hernia Extremities show no edema. neuro grossly intact  ECG Sinus rhythm at a rate of 84. Nonspecific ST changes.

## 2016-02-05 ENCOUNTER — Other Ambulatory Visit: Payer: Self-pay | Admitting: *Deleted

## 2016-02-05 ENCOUNTER — Ambulatory Visit (HOSPITAL_BASED_OUTPATIENT_CLINIC_OR_DEPARTMENT_OTHER)
Admission: RE | Admit: 2016-02-05 | Discharge: 2016-02-05 | Disposition: A | Discharge status: Home / Self Care | Payer: Medicare Other | Source: Ambulatory Visit | Attending: Cardiology | Admitting: Cardiology

## 2016-02-05 DIAGNOSIS — I6523 Occlusion and stenosis of bilateral carotid arteries: Secondary | ICD-10-CM | POA: Diagnosis not present

## 2016-02-05 DIAGNOSIS — R0989 Other specified symptoms and signs involving the circulatory and respiratory systems: Secondary | ICD-10-CM | POA: Insufficient documentation

## 2016-02-05 LAB — BASIC METABOLIC PANEL
BUN: 18 mg/dL (ref 7–25)
CO2: 27 mmol/L (ref 20–31)
Calcium: 8.9 mg/dL (ref 8.6–10.3)
Chloride: 102 mmol/L (ref 98–110)
Creat: 0.89 mg/dL (ref 0.70–1.25)
Glucose, Bld: 163 mg/dL — ABNORMAL HIGH (ref 65–99)
Potassium: 4.3 mmol/L (ref 3.5–5.3)
SODIUM: 140 mmol/L (ref 135–146)

## 2016-02-05 LAB — LIPID PANEL
CHOLESTEROL: 151 mg/dL (ref 125–200)
HDL: 38 mg/dL — ABNORMAL LOW (ref >=40)
LDL Cholesterol: 84 mg/dL (ref <130)
Total CHOL/HDL Ratio: 4 Ratio (ref <=5.0)
Triglycerides: 146 mg/dL (ref <150)
VLDL: 29 mg/dL (ref <30)

## 2016-02-05 LAB — HEPATIC FUNCTION PANEL
ALT: 20 U/L (ref 9–46)
AST: 16 U/L (ref 10–35)
Albumin: 3.9 g/dL (ref 3.6–5.1)
Alkaline Phosphatase: 83 U/L (ref 40–115)
BILIRUBIN DIRECT: 0.1 mg/dL (ref <=0.2)
BILIRUBIN INDIRECT: 0.3 mg/dL (ref 0.2–1.2)
Total Bilirubin: 0.4 mg/dL (ref 0.2–1.2)
Total Protein: 7.1 g/dL (ref 6.1–8.1)

## 2016-02-06 LAB — BRAIN NATRIURETIC PEPTIDE: BRAIN NATRIURETIC PEPTIDE: 24.3 pg/mL (ref <100)

## 2016-05-24 ENCOUNTER — Telehealth: Payer: Self-pay | Admitting: *Deleted

## 2016-05-24 NOTE — Telephone Encounter (Signed)
Pt needs clearance to participate in medically supervised exercise program. They are asking if the pt would have any restrictions. Will forward for dr Jens Somcrenshaw review

## 2016-05-25 NOTE — Telephone Encounter (Signed)
Will fax this note and form to the number provided.

## 2016-05-25 NOTE — Telephone Encounter (Signed)
Ok to participate Wesley MillersBrian Brinden Kincheloe

## 2016-06-09 ENCOUNTER — Telehealth: Payer: Self-pay | Admitting: *Deleted

## 2016-06-09 DIAGNOSIS — E785 Hyperlipidemia, unspecified: Secondary | ICD-10-CM

## 2016-06-09 MED ORDER — PRAVASTATIN SODIUM 80 MG PO TABS: 80.0000 mg | ORAL_TABLET | Freq: Every evening | ORAL | Status: DC

## 2016-06-09 NOTE — Telephone Encounter (Signed)
Pt here with wife, he is c/o leg discomfort from the lipitor. Per dr Jens Somcrenshaw, lipitor d/c and pt started on pravastatin 80 mg once daily Will check lipid/liver function in 4 weeks.

## 2016-07-13 LAB — HEPATIC FUNCTION PANEL
ALBUMIN: 4 g/dL (ref 3.6–5.1)
ALT: 14 U/L (ref 9–46)
AST: 13 U/L (ref 10–35)
Alkaline Phosphatase: 67 U/L (ref 40–115)
BILIRUBIN INDIRECT: 0.4 mg/dL (ref 0.2–1.2)
Bilirubin, Direct: 0.1 mg/dL (ref <=0.2)
Total Bilirubin: 0.5 mg/dL (ref 0.2–1.2)
Total Protein: 6.9 g/dL (ref 6.1–8.1)

## 2016-07-13 LAB — LIPID PANEL
CHOL/HDL RATIO: 4.1 ratio (ref <=5.0)
Cholesterol: 185 mg/dL (ref 125–200)
HDL: 45 mg/dL (ref >=40)
LDL Cholesterol: 104 mg/dL (ref <130)
TRIGLYCERIDES: 180 mg/dL — AB (ref <150)
VLDL: 36 mg/dL — AB (ref <30)

## 2016-07-16 ENCOUNTER — Encounter: Payer: Self-pay | Admitting: *Deleted

## 2016-12-08 ENCOUNTER — Other Ambulatory Visit: Payer: Self-pay | Admitting: Cardiology

## 2016-12-14 ENCOUNTER — Other Ambulatory Visit: Payer: Self-pay

## 2016-12-14 MED ORDER — POTASSIUM CHLORIDE CRYS ER 20 MEQ PO TBCR: EXTENDED_RELEASE_TABLET | ORAL | 0 refills | Status: DC

## 2017-01-26 ENCOUNTER — Other Ambulatory Visit: Payer: Self-pay | Admitting: Cardiology

## 2017-03-09 ENCOUNTER — Other Ambulatory Visit: Payer: Self-pay | Admitting: *Deleted

## 2017-03-09 MED ORDER — METOPROLOL TARTRATE 100 MG PO TABS: 100.0000 mg | ORAL_TABLET | Freq: Two times a day (BID) | ORAL | 3 refills | Status: DC

## 2017-03-09 MED ORDER — METOPROLOL TARTRATE 100 MG PO TABS: 100.0000 mg | ORAL_TABLET | Freq: Two times a day (BID) | ORAL | 0 refills | Status: DC

## 2017-03-10 ENCOUNTER — Encounter: Payer: Self-pay | Admitting: Cardiology

## 2017-03-15 NOTE — Progress Notes (Signed)
HPI: FU coronary artery disease, status post coronary bypassing graft in September 2009. His LV function is preserved. He was also found to have a DVT on that admission and was placed on Coumadin. There is a strong family history of protein S deficiency. The patient was seen by Dr. Myna Hidalgo and Coumadin for one year was recommended. Note he did have blood drawn after discontinuing Coumadin. He did not have a protein S or protein C deficiency. Nuclear study February 2016 showed ejection fraction 54%. There was diaphragmatic attenuation but no ischemia. Carotid dopplers 2/17 showed 1-49 bilateral stenosis. Since I last saw him, he has dyspnea on exertion unchanged. No orthopnea, PND but he does have some pedal edema. No chest pain or syncope.  Current Outpatient Prescriptions  Medication Sig Dispense Refill  . aspirin 81 MG EC tablet Take 81 mg by mouth daily.      Marland Kitchen CALCIUM PO Take 1 tablet by mouth daily.    . folic acid (FOLVITE) 800 MCG tablet Take 800 mcg by mouth daily.      . furosemide (LASIX) 40 MG tablet TAKE 1 TABLET BY MOUTH  EVERY MORNING AND 1/2  TABLETS BY MOUTH EVERY DAY  AT 2PM 135 tablet 1  . lisinopril (PRINIVIL,ZESTRIL) 20 MG tablet TAKE 1 TABLET BY MOUTH  DAILY 90 tablet 0  . metFORMIN (GLUCOPHAGE) 1000 MG tablet Take 1 tablet by mouth daily with breakfast.     . metoprolol (LOPRESSOR) 100 MG tablet Take 1 tablet (100 mg total) by mouth 2 (two) times daily. 180 tablet 3  . potassium chloride SA (K-DUR,KLOR-CON) 20 MEQ tablet Take 1 tablet by mouth  every day 90 tablet 0  . pravastatin (PRAVACHOL) 80 MG tablet Take 1 tablet (80 mg total) by mouth every evening. 90 tablet 3  . ranitidine (ZANTAC) 150 MG capsule Take 150 mg by mouth 2 (two) times daily.      Marland Kitchen sulfaSALAzine (AZULFIDINE) 500 MG tablet Take 1,000 mg by mouth 2 (two) times daily.       No current facility-administered medications for this visit.      Past Medical History:  Diagnosis Date  . Atrial  fibrillation (HCC)    post CABG  . COPD (chronic obstructive pulmonary disease) (HCC)   . Coronary atherosclerosis of artery bypass graft   . DVT (deep venous thrombosis) (HCC)   . Esophageal reflux   . History of protein S deficiency    family  . Hypertension   . Left renal mass    history. status post resection with results being benign  . Pure hypercholesterolemia   . Regional enteritis of unspecified site    with rectal bleeding-dx 3 years ago  . Unspecified cerebral artery occlusion with cerebral infarction     Past Surgical History:  Procedure Laterality Date  . CORONARY ARTERY BYPASS GRAFT  08/2008   with a LIMA to the LAD, vein graft to the diagonal, saphenous vein graft to the acute marginal and a saphenous vein graft to the PDA.  . Prior Resection of a Renal Mass    . REPLACEMENT TOTAL KNEE BILATERAL  11/08 and 6/08    Social History   Social History  . Marital status: Married    Spouse name: N/A  . Number of children: N/A  . Years of education: N/A   Occupational History  . Full Time Upholsterer    Social History Main Topics  . Smoking status: Former Smoker    Packs/day:  1.00    Years: 50.00    Types: Cigarettes    Quit date: 12/13/2004  . Smokeless tobacco: Current User    Types: Chew     Comment: Chew past 3 years. now resolved  . Alcohol use No  . Drug use: No  . Sexual activity: Not on file   Other Topics Concern  . Not on file   Social History Narrative   Lives in [place]    Family History  Problem Relation Age of Onset  . Protein S deficiency Mother     died of CVA    ROS: no fevers or chills, productive cough, hemoptysis, dysphasia, odynophagia, melena, hematochezia, dysuria, hematuria, rash, seizure activity, pedal edema, claudication. Remaining systems are negative.  Physical Exam: Well-developed well-nourished in no acute distress.  Skin is warm and dry.  HEENT is normal.  Neck is supple.  Chest is clear to auscultation with normal  expansion.  Cardiovascular exam is regular rate and rhythm.  Abdominal exam nontender or distended. No masses palpated.Umbilical hernia  Extremities show trace edema. neuro grossly intact  ECG- Sinus rhythm with no ST changes. personally reviewed  A/P  1 Coronary artery disease-continue aspirin and statin.  2 hypertension-blood pressure elevated. Increase lisinopril to 40 mg daily and follow. Check potassium and renal function in 1 week.  3 chronic diastolic congestive heart failure-patient is mildly volume overloaded. Increase Lasix to 60 mg daily. Check potassium and renal function in 1 week.  4 hyperlipidemia-continue statin.   5 dyspnea-this is felt to be multifactorial including COPD and diastolic congestive heart failure. Symptoms are relatively unchanged.  Olga Millers, MD

## 2017-03-23 ENCOUNTER — Encounter: Payer: Self-pay | Admitting: Cardiology

## 2017-03-23 ENCOUNTER — Ambulatory Visit (INDEPENDENT_AMBULATORY_CARE_PROVIDER_SITE_OTHER): Payer: Medicare Other | Admitting: Cardiology

## 2017-03-23 VITALS — BP 161/90 | HR 66 | Ht 66.0 in | Wt 229.1 lb

## 2017-03-23 DIAGNOSIS — I10 Essential (primary) hypertension: Secondary | ICD-10-CM | POA: Diagnosis not present

## 2017-03-23 DIAGNOSIS — I2510 Atherosclerotic heart disease of native coronary artery without angina pectoris: Secondary | ICD-10-CM

## 2017-03-23 DIAGNOSIS — E785 Hyperlipidemia, unspecified: Secondary | ICD-10-CM | POA: Diagnosis not present

## 2017-03-23 DIAGNOSIS — I25810 Atherosclerosis of coronary artery bypass graft(s) without angina pectoris: Secondary | ICD-10-CM | POA: Diagnosis not present

## 2017-03-23 MED ORDER — POTASSIUM CHLORIDE CRYS ER 20 MEQ PO TBCR: EXTENDED_RELEASE_TABLET | ORAL | 3 refills | Status: DC

## 2017-03-23 MED ORDER — PRAVASTATIN SODIUM 80 MG PO TABS: 80.0000 mg | ORAL_TABLET | Freq: Every evening | ORAL | 3 refills | Status: DC

## 2017-03-23 MED ORDER — METOPROLOL TARTRATE 100 MG PO TABS: 100.0000 mg | ORAL_TABLET | Freq: Two times a day (BID) | ORAL | 3 refills | Status: DC

## 2017-03-23 MED ORDER — FUROSEMIDE 40 MG PO TABS
60.0000 mg | ORAL_TABLET | Freq: Every day | ORAL | 3 refills | Status: DC
Start: 2017-03-23 — End: 2018-04-24

## 2017-03-23 MED ORDER — LISINOPRIL 40 MG PO TABS: 40.0000 mg | ORAL_TABLET | Freq: Every day | ORAL | 3 refills | Status: DC

## 2017-03-23 NOTE — Patient Instructions (Signed)
Medication Instructions:   INCREASE FUROSEMIDE TO 60 MG ONCE DAILY= 1 AND 1/2 OF THE 40 MG TABLET ONCE DAILY  INCREASE LISINOPRIL TO 40 MG ONCE DAILY= 2 OF THE 20 MG TABLETS ONCE DAILY  Labwork:  Your physician recommends that you return for lab work in: ONE WEEK  Follow-Up:  Your physician wants you to follow-up in: ONE YEAR WITH DR Shelda Pal will receive a reminder letter in the mail two months in advance. If you don't receive a letter, please call our office to schedule the follow-up appointment.   If you need a refill on your cardiac medications before your next appointment, please call your pharmacy.

## 2017-04-01 LAB — BASIC METABOLIC PANEL
BUN/Creatinine Ratio: 17 (ref 10–24)
BUN: 17 mg/dL (ref 8–27)
CO2: 26 mmol/L (ref 18–29)
Calcium: 9 mg/dL (ref 8.6–10.2)
Chloride: 96 mmol/L (ref 96–106)
Creatinine, Ser: 1.03 mg/dL (ref 0.76–1.27)
GFR calc Af Amer: 85 mL/min/{1.73_m2} (ref >59)
GFR calc non Af Amer: 74 mL/min/{1.73_m2} (ref >59)
GLUCOSE: 271 mg/dL — AB (ref 65–99)
POTASSIUM: 4.3 mmol/L (ref 3.5–5.2)
Sodium: 138 mmol/L (ref 134–144)

## 2017-11-01 ENCOUNTER — Telehealth: Payer: Self-pay | Admitting: *Deleted

## 2017-11-01 NOTE — Telephone Encounter (Signed)
   Chart reviewed as part of pre-operative protocol coverage. Given past medical history and time since last visit, based on ACC/AHA guidelines, [name] would be at acceptable risk for the planned procedure without further cardiovascular testing.   I will route this recommendation to the requesting party via Epic fax function and remove from pre-op pool.  Please call with questions.  Robbie LisBrittainy Micaiah Litle, PA-C 11/01/2017, 3:56 PM

## 2017-11-01 NOTE — Telephone Encounter (Signed)
PATIENT OF [facility] Pre-operative Risk Assessment    Request for surgical clearance:  1. What type of surgery is being performed? CATARACT EXTRACTION W/ INTRAOCULAR LENS IMPLANTATION OF THE LEFT EYE  FOLLOWED BY THE RIGHT EYE  2. When is this surgery scheduled? LEFT EYE  11/30/17  AND RIGHT EYE 12/21/2017  3. Are there any medications that need to be held prior to surgery and how long? MEDIAL CLEARANCE IS REQUIRED, CURRENT DX , MEDICATION LIST  4. Practice name and name of physician performing surgery? [facility] --DR BEVIS  5. What is your office phone and fax number? PHONE [place]. Anesthesia type (None, local, MAC, general) ? UNKNOWN   Raiford Simmonds 11/01/2017, 2:28 PM  _________________________________________________________________   (provider comments below)

## 2017-11-28 ENCOUNTER — Telehealth: Payer: Self-pay | Admitting: Cardiology

## 2017-11-28 NOTE — Telephone Encounter (Signed)
Returned call back to [name], [facility], to let her know that we have re-faxed the preoperative clearance for said pt. I left a message for her to call back if she doesn't receive it.

## 2017-11-28 NOTE — Telephone Encounter (Signed)
Re-faxed clearance over to [facility].

## 2017-11-28 NOTE — Telephone Encounter (Signed)
New message    Please resend pre op clearance to [facility]. They are not on Epic. Office has not received clearance letter. Procedure 11/30/17.  PHONE [phone]  FAX [phone]  ATTN: NICOLE

## 2017-12-12 ENCOUNTER — Telehealth: Payer: Self-pay | Admitting: Cardiology

## 2017-12-12 NOTE — Telephone Encounter (Signed)
Reference#[id number]  Please call concerning pt taking 2 Lisinopril.

## 2017-12-12 NOTE — Telephone Encounter (Signed)
Spoke with [facility] and the 20 mg Rx was actually an old Rx. They do have current Rx of 40 mg daily

## 2017-12-15 ENCOUNTER — Other Ambulatory Visit: Payer: Self-pay | Admitting: *Deleted

## 2017-12-15 MED ORDER — LISINOPRIL 40 MG PO TABS: 40.0000 mg | ORAL_TABLET | Freq: Every day | ORAL | 3 refills | Status: DC

## 2018-01-20 ENCOUNTER — Other Ambulatory Visit: Payer: Self-pay | Admitting: Cardiology

## 2018-01-20 NOTE — Telephone Encounter (Signed)
REFILL 

## 2018-04-04 NOTE — Progress Notes (Signed)
HPI: FU coronary artery disease, status post coronary bypassing graft in September 2009. His LV function is preserved. He was also found to have a DVT on that admission and was placed on Coumadin. There is a strong family history of protein S deficiency. The patient was seen by Dr. Myna Hidalgo and Coumadin for one year was recommended. Note he did have blood drawn after discontinuing Coumadin. He did not have a protein S or protein C deficiency. Nuclear study February 2016 showed ejection fraction 54%. There was diaphragmatic attenuation but no ischemia. Carotid dopplers 2/17 showed 1-49 bilateral stenosis. Since I last saw him,   Current Outpatient Medications  Medication Sig Dispense Refill  . aspirin 81 MG EC tablet Take 81 mg by mouth daily.      Marland Kitchen CALCIUM PO Take 1 tablet by mouth daily.    . empagliflozin (JARDIANCE) 25 MG TABS tablet Take 25 mg by mouth daily.    . folic acid (FOLVITE) 800 MCG tablet Take 800 mcg by mouth daily.      . furosemide (LASIX) 40 MG tablet Take 1.5 tablets (60 mg total) by mouth daily. 180 tablet 3  . lisinopril (PRINIVIL,ZESTRIL) 40 MG tablet Take 1 tablet (40 mg total) by mouth daily. 90 tablet 3  . metFORMIN (GLUCOPHAGE) 1000 MG tablet Take 1 tablet by mouth 2 (two) times daily with a meal.     . metoprolol tartrate (LOPRESSOR) 100 MG tablet Take 1 tablet (100 mg total) by mouth 2 (two) times daily. KEEP OV. 180 tablet 1  . potassium chloride SA (K-DUR,KLOR-CON) 20 MEQ tablet Take 1 tablet by mouth  every day 90 tablet 1  . pravastatin (PRAVACHOL) 80 MG tablet Take 1 tablet (80 mg total) by mouth every evening. 90 tablet 3  . ranitidine (ZANTAC) 150 MG capsule Take 150 mg by mouth 2 (two) times daily.      Marland Kitchen sulfaSALAzine (AZULFIDINE) 500 MG tablet Take 1,000 mg by mouth 2 (two) times daily.       No current facility-administered medications for this visit.      Past Medical History:  Diagnosis Date  . Atrial fibrillation (HCC)    post CABG  . COPD  (chronic obstructive pulmonary disease) (HCC)   . Coronary atherosclerosis of artery bypass graft   . DVT (deep venous thrombosis) (HCC)   . Esophageal reflux   . History of protein S deficiency    family  . Hypertension   . Left renal mass    history. status post resection with results being benign  . Pure hypercholesterolemia   . Regional enteritis of unspecified site    with rectal bleeding-dx 3 years ago  . Unspecified cerebral artery occlusion with cerebral infarction     Past Surgical History:  Procedure Laterality Date  . CORONARY ARTERY BYPASS GRAFT  08/2008   with a LIMA to the LAD, vein graft to the diagonal, saphenous vein graft to the acute marginal and a saphenous vein graft to the PDA.  . Prior Resection of a Renal Mass    . REPLACEMENT TOTAL KNEE BILATERAL  11/08 and 6/08    Social History   Socioeconomic History  . Marital status: Married    Spouse name: Not on file  . Number of children: Not on file  . Years of education: Not on file  . Highest education level: Not on file  Occupational History  . Occupation: Full Time Upholsterer  Social Needs  . Financial resource strain:  Not on file  . Food insecurity:    Worry: Not on file    Inability: Not on file  . Transportation needs:    Medical: Not on file    Non-medical: Not on file  Tobacco Use  . Smoking status: Former Smoker    Packs/day: 1.00    Years: 50.00    Pack years: 50.00    Types: Cigarettes    Last attempt to quit: 12/13/2004    Years since quitting: 13.3  . Smokeless tobacco: Former NeurosurgeonUser    Types: Chew  . Tobacco comment: Chew past 3 years. now resolved  Substance and Sexual Activity  . Alcohol use: No  . Drug use: No  . Sexual activity: Not on file  Lifestyle  . Physical activity:    Days per week: Not on file    Minutes per session: Not on file  . Stress: Not on file  Relationships  . Social connections:    Talks on phone: Not on file    Gets together: Not on file    Attends  religious service: Not on file    Active member of club or organization: Not on file    Attends meetings of clubs or organizations: Not on file    Relationship status: Not on file  . Intimate partner violence:    Fear of current or ex partner: Not on file    Emotionally abused: Not on file    Physically abused: Not on file    Forced sexual activity: Not on file  Other Topics Concern  . Not on file  Social History Narrative   [place] in [place]    Family History  Problem Relation Age of Onset  . Protein S deficiency Mother        died of CVA    ROS: no fevers or chills, productive cough, hemoptysis, dysphasia, odynophagia, melena, hematochezia, dysuria, hematuria, rash, seizure activity, orthopnea, PND, pedal edema, claudication. Remaining systems are negative.  Physical Exam: Well-developed well-nourished in no acute distress.  Skin is warm and dry.  HEENT is normal.  Neck is supple.  Chest is clear to auscultation with normal expansion.  Cardiovascular exam is regular rate and rhythm.  Abdominal exam nontender or distended. No masses palpated. Extremities show no edema. neuro grossly intact  ECG-normal sinus rhythm at a rate of 75.  No ST changes.  Personally reviewed  A/P  1 coronary artery disease-patient doing well with no chest pain.  Continue medical therapy including aspirin and statin.  2 Hypertension-blood pressure is elevated.  However he states typically controlled.  Continue present medications and follow.  If blood pressure remains high we will consider adding amlodipine.  Potassium and renal function monitored by primary care.  3 hyperlipidemia-continue statin.  Lipids and liver monitored by primary care.   4 chronic diastolic congestive heart failure-patient appears to be euvolemic.  Continue present dose of diuretic.  Continue low-sodium diet and fluid restriction.  5 dyspnea-symptoms are unchanged.  This is felt to be multifactorial including diastolic  congestive heart failure and COPD.  Olga MillersBrian Ameir Faria, MD

## 2018-04-12 ENCOUNTER — Encounter: Payer: Self-pay | Admitting: Cardiology

## 2018-04-12 ENCOUNTER — Ambulatory Visit: Payer: Medicare Other | Admitting: Cardiology

## 2018-04-12 VITALS — BP 154/88 | HR 70 | Ht 66.0 in | Wt 233.1 lb

## 2018-04-12 DIAGNOSIS — I10 Essential (primary) hypertension: Secondary | ICD-10-CM

## 2018-04-12 DIAGNOSIS — I25810 Atherosclerosis of coronary artery bypass graft(s) without angina pectoris: Secondary | ICD-10-CM

## 2018-04-12 DIAGNOSIS — I5032 Chronic diastolic (congestive) heart failure: Secondary | ICD-10-CM | POA: Diagnosis not present

## 2018-04-12 DIAGNOSIS — E7800 Pure hypercholesterolemia, unspecified: Secondary | ICD-10-CM

## 2018-04-12 NOTE — Patient Instructions (Signed)
Your physician wants you to follow-up in: ONE YEAR WITH DR CRENSHAW You will receive a reminder letter in the mail two months in advance. If you don't receive a letter, please call our office to schedule the follow-up appointment.   If you need a refill on your cardiac medications before your next appointment, please call your pharmacy.  

## 2018-04-24 ENCOUNTER — Other Ambulatory Visit: Payer: Self-pay | Admitting: Cardiology

## 2018-04-24 NOTE — Telephone Encounter (Signed)
REFILL 

## 2018-07-06 ENCOUNTER — Other Ambulatory Visit: Payer: Self-pay | Admitting: Cardiology

## 2018-08-14 ENCOUNTER — Other Ambulatory Visit: Payer: Self-pay | Admitting: Cardiology

## 2019-02-04 ENCOUNTER — Other Ambulatory Visit: Payer: Self-pay | Admitting: Cardiology

## 2019-04-05 ENCOUNTER — Telehealth: Payer: Self-pay

## 2019-04-05 NOTE — Telephone Encounter (Signed)
Left a detailed message for the patient and his wife about upcoming appointments and that due to the COVID-19 we are calling to switch their appointments to a virtual visit -Video or telephone. Told to call the office back

## 2019-04-11 ENCOUNTER — Institutional Professional Consult (permissible substitution): Payer: Medicare Other | Admitting: Cardiology

## 2019-04-24 ENCOUNTER — Other Ambulatory Visit: Payer: Self-pay | Admitting: Cardiology

## 2019-06-30 ENCOUNTER — Other Ambulatory Visit: Payer: Self-pay | Admitting: Cardiology

## 2019-08-06 NOTE — Progress Notes (Addendum)
HPI: FU coronary artery disease, status post coronary bypassing graft in September 2009. His LV function is preserved. He was also found to have a DVT on that admission and was placed on Coumadin. There is a strong family history of protein S deficiency. The patient was seen by Dr. Myna HidalgoEnnever and Coumadin for one year was recommended. Note he did have blood drawn after discontinuing Coumadin. He did not have a protein S or protein C deficiency. Nuclear study February 2016 showed ejection fraction 54%. There was diaphragmatic attenuation but no ischemia.Carotid dopplers 2/17 showed 1-49 bilateral stenosis.Since I last saw him,the patient has dyspnea with more extreme activities but not with routine activities. It is relieved with rest. It is not associated with chest pain. There is no orthopnea, PND or pedal edema. There is no syncope or palpitations. There is no exertional chest pain.   Current Outpatient Medications  Medication Sig Dispense Refill  . aspirin 81 MG EC tablet Take 81 mg by mouth daily.      Marland Kitchen. CALCIUM PO Take 1 tablet by mouth daily.    . empagliflozin (JARDIANCE) 25 MG TABS tablet Take 25 mg by mouth daily.    . famotidine (PEPCID) 20 MG tablet Take 20 mg by mouth 2 (two) times daily.    . folic acid (FOLVITE) 800 MCG tablet Take 800 mcg by mouth daily.      . furosemide (LASIX) 40 MG tablet TAKE 1 AND 1/2 TABLETS BY  MOUTH DAILY 135 tablet 0  . lisinopril (ZESTRIL) 40 MG tablet TAKE 1 TABLET BY MOUTH  DAILY 90 tablet 0  . metFORMIN (GLUCOPHAGE) 1000 MG tablet Take 1 tablet by mouth 2 (two) times daily with a meal.     . metoprolol tartrate (LOPRESSOR) 100 MG tablet TAKE 1 TABLET BY MOUTH  TWICE A DAY 180 tablet 0  . potassium chloride SA (K-DUR) 20 MEQ tablet TAKE 1 TABLET BY MOUTH  DAILY 90 tablet 0  . pravastatin (PRAVACHOL) 80 MG tablet TAKE 1 TABLET BY MOUTH IN  THE EVENING 90 tablet 0  . sulfaSALAzine (AZULFIDINE) 500 MG tablet Take 1,000 mg by mouth 2 (two) times  daily.       No current facility-administered medications for this visit.      Past Medical History:  Diagnosis Date  . Atrial fibrillation (HCC)    post CABG  . COPD (chronic obstructive pulmonary disease) (HCC)   . Coronary atherosclerosis of artery bypass graft   . DVT (deep venous thrombosis) (HCC)   . Esophageal reflux   . History of protein S deficiency    family  . Hypertension   . Left renal mass    history. status post resection with results being benign  . Pure hypercholesterolemia   . Regional enteritis of unspecified site    with rectal bleeding-dx 3 years ago  . Unspecified cerebral artery occlusion with cerebral infarction     Past Surgical History:  Procedure Laterality Date  . CORONARY ARTERY BYPASS GRAFT  08/2008   with a LIMA to the LAD, vein graft to the diagonal, saphenous vein graft to the acute marginal and a saphenous vein graft to the PDA.  . Prior Resection of a Renal Mass    . REPLACEMENT TOTAL KNEE BILATERAL  11/08 and 6/08    Social History   Socioeconomic History  . Marital status: Married    Spouse name: Not on file  . Number of children: Not on file  .  Years of education: Not on file  . Highest education level: Not on file  Occupational History  . Occupation: Full Time Upholsterer  Social Needs  . Financial resource strain: Not on file  . Food insecurity    Worry: Not on file    Inability: Not on file  . Transportation needs    Medical: Not on file    Non-medical: Not on file  Tobacco Use  . Smoking status: Former Smoker    Packs/day: 1.00    Years: 50.00    Pack years: 50.00    Types: Cigarettes    Quit date: 12/13/2004    Years since quitting: 14.6  . Smokeless tobacco: Former Systems developer    Types: Chew  . Tobacco comment: Chew past 3 years. now resolved  Substance and Sexual Activity  . Alcohol use: No  . Drug use: No  . Sexual activity: Not on file  Lifestyle  . Physical activity    Days per week: Not on file    Minutes per  session: Not on file  . Stress: Not on file  Relationships  . Social Herbalist on phone: Not on file    Gets together: Not on file    Attends religious service: Not on file    Active member of club or organization: Not on file    Attends meetings of clubs or organizations: Not on file    Relationship status: Not on file  . Intimate partner violence    Fear of current or ex partner: Not on file    Emotionally abused: Not on file    Physically abused: Not on file    Forced sexual activity: Not on file  Other Topics Concern  . Not on file  Social History Narrative   Lives in [place]    Family History  Problem Relation Age of Onset  . Protein S deficiency Mother        died of CVA    ROS: no fevers or chills, productive cough, hemoptysis, dysphasia, odynophagia, melena, hematochezia, dysuria, hematuria, rash, seizure activity, orthopnea, PND, pedal edema, claudication. Remaining systems are negative.  Physical Exam: Well-developed well-nourished in no acute distress.  Skin is warm and dry.  HEENT is normal.  Neck is supple.  Chest is clear to auscultation with normal expansion.  Cardiovascular exam is regular rate and rhythm.  Abdominal exam nontender or distended. No masses palpated. Positive bruit Extremities show no edema. neuro grossly intact  ECG-sinus rhythm at a rate of 65, no ST changes.  Personally reviewed  A/P  1 coronary artery disease-patient denies recurrent chest pain.  Continue medical therapy with aspirin and statin.  2 hypertension-blood pressure is elevated; however he states typically controlled.  Continue present medications.  Follow blood pressure and add additional medications as needed.  He will track this at home.  3 hyperlipidemia-continue statin.  Check lipids and liver.  4 chronic diastolic congestive heart failure-patient is not volume overloaded on examination.  Continue present dose of diuretic.  Continue fluid restriction and  low-sodium diet.  Check potassium and renal function.  5 dyspnea-felt to be multifactorial including diastolic congestive heart failure and COPD.  6 bruit-schedule abdominal ultrasound to exclude aneurysm.  Kirk Ruths, MD

## 2019-08-08 ENCOUNTER — Ambulatory Visit (INDEPENDENT_AMBULATORY_CARE_PROVIDER_SITE_OTHER): Payer: Medicare Other | Admitting: Cardiology

## 2019-08-08 ENCOUNTER — Encounter: Payer: Self-pay | Admitting: Cardiology

## 2019-08-08 ENCOUNTER — Other Ambulatory Visit: Payer: Self-pay

## 2019-08-08 VITALS — BP 162/84 | HR 65 | Ht 66.0 in | Wt 221.0 lb

## 2019-08-08 DIAGNOSIS — I10 Essential (primary) hypertension: Secondary | ICD-10-CM

## 2019-08-08 DIAGNOSIS — E7800 Pure hypercholesterolemia, unspecified: Secondary | ICD-10-CM

## 2019-08-08 DIAGNOSIS — R0989 Other specified symptoms and signs involving the circulatory and respiratory systems: Secondary | ICD-10-CM

## 2019-08-08 DIAGNOSIS — I25810 Atherosclerosis of coronary artery bypass graft(s) without angina pectoris: Secondary | ICD-10-CM | POA: Diagnosis not present

## 2019-08-08 NOTE — Patient Instructions (Signed)
Medication Instructions:  NO CHANGE If you need a refill on your cardiac medications before your next appointment, please call your pharmacy.   Lab work: If you have labs (blood work) drawn today and your tests are completely normal, you will receive your results only by: Marland Kitchen MyChart Message (if you have MyChart) OR . A paper copy in the mail If you have any lab test that is abnormal or we need to change your treatment, we will call you to review the results.  Testing/Procedures: Your physician has requested that you have an abdominal aorta duplex. During this test, an ultrasound is used to evaluate the aorta. Allow 30 minutes for this exam. Do not eat after midnight the day before and avoid carbonated beverages AT THE [place] [facility]ICE  Follow-Up: At [facility], you and your health needs are our priority.  As part of our continuing mission to provide you with exceptional heart care, we have created designated Provider Care Teams.  These Care Teams include your primary Cardiologist (physician) and Advanced Practice Providers (APPs -  Physician Assistants and Nurse Practitioners) who all work together to provide you with the care you need, when you need it. Your physician wants you to follow-up in: [place] will receive a reminder letter in the mail two months in advance. If you don't receive a letter, please call our office to schedule the follow-up appointment.

## 2019-08-08 NOTE — Addendum Note (Signed)
Addended by: Cristopher Estimable on: 08/08/2019 09:29 AM   Modules accepted: Orders

## 2019-08-09 ENCOUNTER — Ambulatory Visit (HOSPITAL_BASED_OUTPATIENT_CLINIC_OR_DEPARTMENT_OTHER)
Admission: RE | Admit: 2019-08-09 | Discharge: 2019-08-09 | Disposition: A | Discharge status: Home / Self Care | Payer: Medicare Other | Source: Ambulatory Visit | Attending: Cardiology | Admitting: Cardiology

## 2019-08-09 ENCOUNTER — Telehealth: Payer: Self-pay | Admitting: *Deleted

## 2019-08-09 DIAGNOSIS — I110 Hypertensive heart disease with heart failure: Secondary | ICD-10-CM | POA: Insufficient documentation

## 2019-08-09 DIAGNOSIS — E785 Hyperlipidemia, unspecified: Secondary | ICD-10-CM | POA: Diagnosis not present

## 2019-08-09 DIAGNOSIS — I5032 Chronic diastolic (congestive) heart failure: Secondary | ICD-10-CM | POA: Insufficient documentation

## 2019-08-09 DIAGNOSIS — R0989 Other specified symptoms and signs involving the circulatory and respiratory systems: Secondary | ICD-10-CM | POA: Insufficient documentation

## 2019-08-09 DIAGNOSIS — I2510 Atherosclerotic heart disease of native coronary artery without angina pectoris: Secondary | ICD-10-CM | POA: Diagnosis not present

## 2019-08-09 DIAGNOSIS — E7800 Pure hypercholesterolemia, unspecified: Secondary | ICD-10-CM

## 2019-08-09 LAB — COMPREHENSIVE METABOLIC PANEL
ALT: 16 IU/L (ref 0–44)
AST: 14 IU/L (ref 0–40)
Albumin/Globulin Ratio: 1.4 (ref 1.2–2.2)
Albumin: 4.1 g/dL (ref 3.7–4.7)
Alkaline Phosphatase: 78 IU/L (ref 39–117)
BUN/Creatinine Ratio: 20 (ref 10–24)
BUN: 21 mg/dL (ref 8–27)
Bilirubin Total: 0.4 mg/dL (ref 0.0–1.2)
CO2: 22 mmol/L (ref 20–29)
Calcium: 9.3 mg/dL (ref 8.6–10.2)
Chloride: 100 mmol/L (ref 96–106)
Creatinine, Ser: 1.07 mg/dL (ref 0.76–1.27)
GFR calc Af Amer: 80 mL/min/{1.73_m2} (ref >59)
GFR calc non Af Amer: 69 mL/min/{1.73_m2} (ref >59)
Globulin, Total: 2.9 g/dL (ref 1.5–4.5)
Glucose: 160 mg/dL — ABNORMAL HIGH (ref 65–99)
Potassium: 4.9 mmol/L (ref 3.5–5.2)
Sodium: 137 mmol/L (ref 134–144)
Total Protein: 7 g/dL (ref 6.0–8.5)

## 2019-08-09 LAB — LIPID PANEL
Chol/HDL Ratio: 4.5 ratio (ref 0.0–5.0)
Cholesterol, Total: 184 mg/dL (ref 100–199)
HDL: 41 mg/dL (ref >39)
LDL Calculated: 85 mg/dL (ref 0–99)
Triglycerides: 292 mg/dL — ABNORMAL HIGH (ref 0–149)
VLDL Cholesterol Cal: 58 mg/dL — ABNORMAL HIGH (ref 5–40)

## 2019-08-09 MED ORDER — ROSUVASTATIN CALCIUM 40 MG PO TABS: 40.0000 mg | ORAL_TABLET | Freq: Every day | ORAL | 3 refills | Status: DC

## 2019-08-09 NOTE — Progress Notes (Addendum)
Abdominal Aorta doppler performed    08/09/19 [name] 08/09/2019, 9:39 AM

## 2019-08-09 NOTE — Telephone Encounter (Signed)
pt aware of results, New script sent to the pharmacy and Lab orders mailed to the pt  

## 2019-08-09 NOTE — Telephone Encounter (Signed)
-----   Message from Lelon Perla, MD sent at 08/09/2019  6:57 AM EDT ----- DC pravastatin; crestor 40 mg daily; lipids and liver 6 weeks Kirk Ruths

## 2019-09-20 LAB — LIPID PANEL
Chol/HDL Ratio: 3.3 ratio (ref 0.0–5.0)
Cholesterol, Total: 143 mg/dL (ref 100–199)
HDL: 43 mg/dL (ref >39)
LDL Chol Calc (NIH): 65 mg/dL (ref 0–99)
Triglycerides: 211 mg/dL — ABNORMAL HIGH (ref 0–149)
VLDL Cholesterol Cal: 35 mg/dL (ref 5–40)

## 2019-09-20 LAB — HEPATIC FUNCTION PANEL
ALT: 18 IU/L (ref 0–44)
AST: 17 IU/L (ref 0–40)
Albumin: 4.2 g/dL (ref 3.7–4.7)
Alkaline Phosphatase: 99 IU/L (ref 39–117)
Bilirubin Total: 0.3 mg/dL (ref 0.0–1.2)
Bilirubin, Direct: 0.12 mg/dL (ref 0.00–0.40)
Total Protein: 7.2 g/dL (ref 6.0–8.5)

## 2019-09-30 ENCOUNTER — Other Ambulatory Visit: Payer: Self-pay | Admitting: Cardiology

## 2019-10-22 ENCOUNTER — Other Ambulatory Visit: Payer: Self-pay

## 2019-10-22 DIAGNOSIS — E7800 Pure hypercholesterolemia, unspecified: Secondary | ICD-10-CM

## 2019-10-22 MED ORDER — ROSUVASTATIN CALCIUM 40 MG PO TABS: 40.0000 mg | ORAL_TABLET | Freq: Every day | ORAL | 3 refills | Status: DC

## 2019-10-25 ENCOUNTER — Other Ambulatory Visit: Payer: Self-pay | Admitting: Cardiology

## 2019-11-20 ENCOUNTER — Other Ambulatory Visit: Payer: Self-pay | Admitting: Cardiology

## 2020-07-17 ENCOUNTER — Telehealth: Payer: Self-pay | Admitting: *Deleted

## 2020-07-17 NOTE — Telephone Encounter (Signed)
A detailed message was left, re: his follow up appointment.

## 2020-08-04 ENCOUNTER — Other Ambulatory Visit: Payer: Self-pay | Admitting: Cardiology

## 2020-08-04 DIAGNOSIS — E7800 Pure hypercholesterolemia, unspecified: Secondary | ICD-10-CM

## 2020-08-25 NOTE — Progress Notes (Signed)
HPI: FU coronary artery disease, status post coronary bypassing graft in September 2009. His LV function is preserved. He was also found to have a DVT on that admission and was placed on Coumadin. There is a strong family history of protein S deficiency. The patient was seen by Dr. Myna Hidalgo and Coumadin for one year was recommended. Note he did have blood drawn after discontinuing Coumadin. He did not have a protein S or protein C deficiency. Nuclear study February 2016 showed ejection fraction 54%. There was diaphragmatic attenuation but no ischemia. Carotid dopplers 2/17 showed 1-49 bilateral stenosis. Abdominal ultrasound August 2020 showed no aneurysm.  Since I last saw him,he has some dyspnea on exertion unchanged.  No orthopnea, PND, pedal edema or chest pain.  Current Outpatient Medications  Medication Sig Dispense Refill  . aspirin 81 MG EC tablet Take 81 mg by mouth daily.      Marland Kitchen CALCIUM PO Take 1 tablet by mouth daily.    Marland Kitchen doxazosin (CARDURA) 2 MG tablet Take 2 mg by mouth daily.    . Empagliflozin-metFORMIN HCl (SYNJARDY) 04-999 MG TABS Take 1 tablet by mouth daily.    . famotidine (PEPCID) 20 MG tablet Take 20 mg by mouth 2 (two) times daily.    . folic acid (FOLVITE) 800 MCG tablet Take 800 mcg by mouth daily.      . furosemide (LASIX) 40 MG tablet TAKE 1 AND 1/2 TABLETS BY  MOUTH DAILY 135 tablet 3  . lisinopril (ZESTRIL) 40 MG tablet TAKE 1 TABLET BY MOUTH  DAILY 30 tablet 1  . metoprolol tartrate (LOPRESSOR) 100 MG tablet TAKE 1 TABLET BY MOUTH  TWICE DAILY 60 tablet 1  . potassium chloride SA (KLOR-CON) 20 MEQ tablet TAKE 1 TABLET BY MOUTH  DAILY 90 tablet 3  . rosuvastatin (CRESTOR) 40 MG tablet TAKE 1 TABLET BY MOUTH  DAILY 30 tablet 1  . Semaglutide (RYBELSUS) 3 MG TABS Take 1 tablet by mouth daily.    Marland Kitchen sulfaSALAzine (AZULFIDINE) 500 MG tablet Take 1,000 mg by mouth 2 (two) times daily.       No current facility-administered medications for this visit.     Past  Medical History:  Diagnosis Date  . Atrial fibrillation (HCC)    post CABG  . COPD (chronic obstructive pulmonary disease) (HCC)   . Coronary atherosclerosis of artery bypass graft   . DVT (deep venous thrombosis) (HCC)   . Esophageal reflux   . History of protein S deficiency    family  . Hypertension   . Left renal mass    history. status post resection with results being benign  . Pure hypercholesterolemia   . Regional enteritis of unspecified site    with rectal bleeding-dx 3 years ago  . Unspecified cerebral artery occlusion with cerebral infarction     Past Surgical History:  Procedure Laterality Date  . CORONARY ARTERY BYPASS GRAFT  08/2008   with a LIMA to the LAD, vein graft to the diagonal, saphenous vein graft to the acute marginal and a saphenous vein graft to the PDA.  . Prior Resection of a Renal Mass    . REPLACEMENT TOTAL KNEE BILATERAL  11/08 and 6/08    Social History   Socioeconomic History  . Marital status: Married    Spouse name: Not on file  . Number of children: Not on file  . Years of education: Not on file  . Highest education level: Not on file  Occupational  History  . Occupation: Full Time Upholsterer  Tobacco Use  . Smoking status: Former Smoker    Packs/day: 1.00    Years: 50.00    Pack years: 50.00    Types: Cigarettes    Quit date: 12/13/2004    Years since quitting: 15.7  . Smokeless tobacco: Former Neurosurgeon    Types: Chew  . Tobacco comment: Chew past 3 years. now resolved  Substance and Sexual Activity  . Alcohol use: No  . Drug use: No  . Sexual activity: Not on file  Other Topics Concern  . Not on file  Social History Narrative   Lives in [place]   Social Determinants of Health   Financial Resource Strain:   . Difficulty of Paying Living Expenses: Not on file  Food Insecurity:   . Worried About Programme researcher, broadcasting/film/video in the Last Year: Not on file  . Ran Out of Food in the Last Year: Not on file  Transportation Needs:   . Lack  of Transportation (Medical): Not on file  . Lack of Transportation (Non-Medical): Not on file  Physical Activity:   . Days of Exercise per Week: Not on file  . Minutes of Exercise per Session: Not on file  Stress:   . Feeling of Stress : Not on file  Social Connections:   . Frequency of Communication with Friends and Family: Not on file  . Frequency of Social Gatherings with Friends and Family: Not on file  . Attends Religious Services: Not on file  . Active Member of Clubs or Organizations: Not on file  . Attends Banker Meetings: Not on file  . Marital Status: Not on file  Intimate Partner Violence:   . Fear of Current or Ex-Partner: Not on file  . Emotionally Abused: Not on file  . Physically Abused: Not on file  . Sexually Abused: Not on file    Family History  Problem Relation Age of Onset  . Protein S deficiency Mother        died of CVA    ROS: no fevers or chills, productive cough, hemoptysis, dysphasia, odynophagia, melena, hematochezia, dysuria, hematuria, rash, seizure activity, orthopnea, PND, pedal edema, claudication. Remaining systems are negative.  Physical Exam: Well-developed well-nourished in no acute distress.  Skin is warm and dry.  HEENT is normal.  Neck is supple.  Chest is clear to auscultation with normal expansion.  Cardiovascular exam is regular rate and rhythm.  Abdominal exam nontender or distended. No masses palpated. Extremities show no edema. neuro grossly intact  ECG-normal sinus rhythm at a rate of 70, no ST changes.  Personally reviewed  A/P  1 coronary artery disease-patient doing well with no chest pain.  Continue medical therapy with aspirin and statin.  2 hypertension-blood pressure elevated; however he states typically controlled.  Have asked him to follow this and we will add additional medications as needed.  3 hyperlipidemia-continue statin.  Check lipids and liver.  4 chronic diastolic congestive heart  failure-he is euvolemic.  Continue diuretic at present dose.  Check potassium and renal function.  5 history of dyspnea-felt to be multifactorial including COPD and diastolic CHF.  Olga Millers, MD

## 2020-09-08 ENCOUNTER — Encounter: Payer: Self-pay | Admitting: Cardiology

## 2020-09-08 ENCOUNTER — Other Ambulatory Visit: Payer: Self-pay

## 2020-09-08 ENCOUNTER — Ambulatory Visit: Payer: Medicare Other | Admitting: Cardiology

## 2020-09-08 VITALS — BP 154/78 | HR 73 | Ht 66.0 in | Wt 222.0 lb

## 2020-09-08 DIAGNOSIS — I10 Essential (primary) hypertension: Secondary | ICD-10-CM

## 2020-09-08 DIAGNOSIS — I25810 Atherosclerosis of coronary artery bypass graft(s) without angina pectoris: Secondary | ICD-10-CM

## 2020-09-08 DIAGNOSIS — E7800 Pure hypercholesterolemia, unspecified: Secondary | ICD-10-CM

## 2020-09-08 NOTE — Patient Instructions (Signed)

## 2020-09-09 ENCOUNTER — Encounter: Payer: Self-pay | Admitting: *Deleted

## 2020-09-09 LAB — COMPREHENSIVE METABOLIC PANEL
ALT: 19 IU/L (ref 0–44)
AST: 18 IU/L (ref 0–40)
Albumin/Globulin Ratio: 1.6 (ref 1.2–2.2)
Albumin: 4.5 g/dL (ref 3.7–4.7)
Alkaline Phosphatase: 83 IU/L (ref 44–121)
BUN/Creatinine Ratio: 19 (ref 10–24)
BUN: 22 mg/dL (ref 8–27)
Bilirubin Total: 0.3 mg/dL (ref 0.0–1.2)
CO2: 25 mmol/L (ref 20–29)
Calcium: 9.3 mg/dL (ref 8.6–10.2)
Chloride: 103 mmol/L (ref 96–106)
Creatinine, Ser: 1.17 mg/dL (ref 0.76–1.27)
GFR calc Af Amer: 71 mL/min/{1.73_m2} (ref >59)
GFR calc non Af Amer: 61 mL/min/{1.73_m2} (ref >59)
Globulin, Total: 2.9 g/dL (ref 1.5–4.5)
Glucose: 134 mg/dL — ABNORMAL HIGH (ref 65–99)
Potassium: 5.2 mmol/L (ref 3.5–5.2)
Sodium: 143 mmol/L (ref 134–144)
Total Protein: 7.4 g/dL (ref 6.0–8.5)

## 2020-09-09 LAB — LIPID PANEL
Chol/HDL Ratio: 3.5 ratio (ref 0.0–5.0)
Cholesterol, Total: 138 mg/dL (ref 100–199)
HDL: 40 mg/dL (ref >39)
LDL Chol Calc (NIH): 62 mg/dL (ref 0–99)
Triglycerides: 220 mg/dL — ABNORMAL HIGH (ref 0–149)
VLDL Cholesterol Cal: 36 mg/dL (ref 5–40)

## 2020-10-18 ENCOUNTER — Other Ambulatory Visit: Payer: Self-pay | Admitting: Cardiology

## 2020-10-18 DIAGNOSIS — E7800 Pure hypercholesterolemia, unspecified: Secondary | ICD-10-CM

## 2021-02-06 ENCOUNTER — Other Ambulatory Visit: Payer: Self-pay | Admitting: Cardiology

## 2021-03-02 ENCOUNTER — Other Ambulatory Visit: Payer: Self-pay

## 2021-03-02 MED ORDER — FUROSEMIDE 40 MG PO TABS
60.0000 mg | ORAL_TABLET | Freq: Every day | ORAL | 3 refills | Status: DC
Start: 2021-03-02 — End: 2021-12-11

## 2021-09-21 ENCOUNTER — Other Ambulatory Visit: Payer: Self-pay | Admitting: Cardiology

## 2021-09-21 DIAGNOSIS — E7800 Pure hypercholesterolemia, unspecified: Secondary | ICD-10-CM

## 2021-12-10 ENCOUNTER — Other Ambulatory Visit: Payer: Self-pay | Admitting: Cardiology

## 2021-12-15 ENCOUNTER — Other Ambulatory Visit: Payer: Self-pay | Admitting: Cardiology

## 2021-12-15 DIAGNOSIS — E7800 Pure hypercholesterolemia, unspecified: Secondary | ICD-10-CM

## 2021-12-29 NOTE — Progress Notes (Signed)
HPI: FU coronary artery disease, status post coronary bypassing graft in September 2009. His LV function is preserved. He was also found to have a DVT on that admission and was placed on Coumadin. There is a strong family history of protein S deficiency. The patient was seen by Dr. Marin Olp and Coumadin for one year was recommended. Note he did have blood drawn after discontinuing Coumadin. He did not have a protein S or protein C deficiency. Nuclear study February 2016 showed ejection fraction 54%. There was diaphragmatic attenuation but no ischemia. Carotid dopplers 2/17 showed 1-49 bilateral stenosis.  Abdominal ultrasound August 2020 showed no aneurysm.  Since I last saw him, the patient has dyspnea with more extreme activities but not with routine activities. It is relieved with rest. It is not associated with chest pain. There is no orthopnea, PND or pedal edema. There is no syncope or palpitations. There is no exertional chest pain.   Current Outpatient Medications  Medication Sig Dispense Refill   aspirin 81 MG EC tablet Take 81 mg by mouth daily.       CALCIUM PO Take 1 tablet by mouth daily.     doxazosin (CARDURA) 2 MG tablet Take 2 mg by mouth daily.     Empagliflozin-metFORMIN HCl (SYNJARDY) 04-999 MG TABS Take 1 tablet by mouth daily.     folic acid (FOLVITE) Q000111Q MCG tablet Take 800 mcg by mouth daily.       furosemide (LASIX) 40 MG tablet TAKE 1 AND 1/2 TABLETS BY  MOUTH DAILY 135 tablet 0   lisinopril (ZESTRIL) 40 MG tablet TAKE 1 TABLET BY MOUTH  DAILY 90 tablet 3   metoprolol tartrate (LOPRESSOR) 100 MG tablet TAKE 1 TABLET BY MOUTH  TWICE DAILY 180 tablet 3   potassium chloride SA (KLOR-CON) 20 MEQ tablet TAKE 1 TABLET BY MOUTH  DAILY 90 tablet 3   rosuvastatin (CRESTOR) 40 MG tablet TAKE 1 TABLET BY MOUTH  DAILY 90 tablet 3   Semaglutide (RYBELSUS) 3 MG TABS Take 1 tablet by mouth daily.     No current facility-administered medications for this visit.     Past  Medical History:  Diagnosis Date   Atrial fibrillation ([place])    post CABG   COPD (chronic obstructive pulmonary disease) ([facility])    Coronary atherosclerosis of artery bypass graft    DVT (deep venous thrombosis) (HCC)    Esophageal reflux    History of protein S deficiency    family   Hypertension    Left renal mass    history. status post resection with results being benign   Pure hypercholesterolemia    Regional enteritis of unspecified site    with rectal bleeding-dx 3 years ago   Unspecified cerebral artery occlusion with cerebral infarction     Past Surgical History:  Procedure Laterality Date   CORONARY ARTERY BYPASS GRAFT  08/2008   with a LIMA to the LAD, vein graft to the diagonal, saphenous vein graft to the acute marginal and a saphenous vein graft to the PDA.   Prior Resection of a Renal Mass     REPLACEMENT TOTAL KNEE BILATERAL  11/08 and 6/08    Social History   Socioeconomic History   Marital status: Married    Spouse name: Not on file   Number of children: Not on file   Years of education: Not on file   Highest education level: Not on file  Occupational History   Occupation: Full Time  Upholsterer  Tobacco Use   Smoking status: Former    Packs/day: 1.00    Years: 50.00    Pack years: 50.00    Types: Cigarettes    Quit date: 12/13/2004    Years since quitting: 17.0   Smokeless tobacco: Former    Types: Chew   Tobacco comments:    Chew past 3 years. now resolved  Substance and Sexual Activity   Alcohol use: No   Drug use: No   Sexual activity: Not on file  Other Topics Concern   Not on file  Social History Narrative   Lives in [place]   Social Determinants of Health   Financial Resource Strain: Not on file  Food Insecurity: Not on file  Transportation Needs: Not on file  Physical Activity: Not on file  Stress: Not on file  Social Connections: Not on file  Intimate Partner Violence: Not on file    Family History  Problem Relation Age of  Onset   Protein S deficiency Mother        died of CVA    ROS: no fevers or chills, productive cough, hemoptysis, dysphasia, odynophagia, melena, hematochezia, dysuria, hematuria, rash, seizure activity, orthopnea, PND, pedal edema, claudication. Remaining systems are negative.  Physical Exam: Well-developed well-nourished in no acute distress.  Skin is warm and dry.  HEENT is normal.  Neck is supple. Bilateral bruits Chest is clear to auscultation with normal expansion.  Cardiovascular exam is regular rate and rhythm.  Abdominal exam nontender or distended. No masses palpated. Extremities show no edema. neuro grossly intact  ECG-normal sinus rhythm at a rate of 87, no ST changes.  Personally reviewed  A/P  1 coronary artery disease-patient doing well with no chest pain.  Continue aspirin and statin.  2 hypertension-blood pressure controlled.  Continue present medical regimen.  3 hyperlipidemia-continue statin. Will have most recent lipids and liver forwarded from primary care.  4 chronic diastolic congestive heart failure-patient remains euvolemic on examination.  We will continue diuretic at present dose. Will have most recent BMET forwarded from primary care.  5 history of dyspnea-this is felt to be multifactorial including combined diastolic congestive heart failure and COPD.  6 carotid bruits-schedule dopplers to further assess.  Kirk Ruths, MD

## 2022-01-06 ENCOUNTER — Encounter: Payer: Self-pay | Admitting: Cardiology

## 2022-01-06 ENCOUNTER — Ambulatory Visit: Payer: Medicare Other | Admitting: Cardiology

## 2022-01-06 ENCOUNTER — Other Ambulatory Visit: Payer: Self-pay

## 2022-01-06 VITALS — BP 140/74 | HR 87 | Ht 66.0 in | Wt 214.0 lb

## 2022-01-06 DIAGNOSIS — I5032 Chronic diastolic (congestive) heart failure: Secondary | ICD-10-CM | POA: Diagnosis not present

## 2022-01-06 DIAGNOSIS — I10 Essential (primary) hypertension: Secondary | ICD-10-CM

## 2022-01-06 DIAGNOSIS — I25810 Atherosclerosis of coronary artery bypass graft(s) without angina pectoris: Secondary | ICD-10-CM

## 2022-01-06 DIAGNOSIS — E7800 Pure hypercholesterolemia, unspecified: Secondary | ICD-10-CM

## 2022-01-06 DIAGNOSIS — R0989 Other specified symptoms and signs involving the circulatory and respiratory systems: Secondary | ICD-10-CM

## 2022-01-06 NOTE — Patient Instructions (Signed)
°  Testing/Procedures:   Your physician has requested that you have a carotid duplex. This test is an ultrasound of the carotid arteries in your neck. It looks at blood flow through these arteries that supply the brain with blood. Allow one hour for this exam. There are no restrictions or special instructions. [facility] [facility]-1ST FLOOR IMAGING DEPARTMENT   Follow-Up: At [facility], you and your health needs are our priority.  As part of our continuing mission to provide you with exceptional heart care, we have created designated Provider Care Teams.  These Care Teams include your primary Cardiologist (physician) and Advanced Practice Providers (APPs -  Physician Assistants and Nurse Practitioners) who all work together to provide you with the care you need, when you need it.  We recommend signing up for the patient portal called "MyChart".  Sign up information is provided on this After Visit Summary.  MyChart is used to connect with patients for Virtual Visits (Telemedicine).  Patients are able to view lab/test results, encounter notes, upcoming appointments, etc.  Non-urgent messages can be sent to your provider as well.   To learn more about what you can do with MyChart, go to [email or web address].    Your next appointment:   12 month(s)  The format for your next appointment:   In Person  Provider:   Olga Millers MD

## 2022-01-11 ENCOUNTER — Other Ambulatory Visit: Payer: Self-pay

## 2022-01-11 ENCOUNTER — Ambulatory Visit (HOSPITAL_BASED_OUTPATIENT_CLINIC_OR_DEPARTMENT_OTHER)
Admission: RE | Admit: 2022-01-11 | Discharge: 2022-01-11 | Disposition: A | Discharge status: Home / Self Care | Payer: Medicare Other | Source: Ambulatory Visit | Attending: Cardiology | Admitting: Cardiology

## 2022-01-11 DIAGNOSIS — R0989 Other specified symptoms and signs involving the circulatory and respiratory systems: Secondary | ICD-10-CM | POA: Diagnosis present

## 2022-01-15 ENCOUNTER — Encounter: Payer: Self-pay | Admitting: *Deleted

## 2022-02-12 ENCOUNTER — Other Ambulatory Visit: Payer: Self-pay | Admitting: Cardiology

## 2022-03-03 ENCOUNTER — Other Ambulatory Visit: Payer: Self-pay | Admitting: Cardiology

## 2022-09-04 ENCOUNTER — Other Ambulatory Visit: Payer: Self-pay | Admitting: Cardiology

## 2022-09-04 DIAGNOSIS — E7800 Pure hypercholesterolemia, unspecified: Secondary | ICD-10-CM

## 2022-11-13 ENCOUNTER — Other Ambulatory Visit: Payer: Self-pay | Admitting: Cardiology

## 2022-11-13 DIAGNOSIS — E7800 Pure hypercholesterolemia, unspecified: Secondary | ICD-10-CM

## 2022-11-22 ENCOUNTER — Other Ambulatory Visit: Payer: Self-pay | Admitting: Cardiology

## 2022-12-09 ENCOUNTER — Other Ambulatory Visit: Payer: Self-pay | Admitting: Cardiology

## 2023-01-10 NOTE — Progress Notes (Signed)
HPI: FU coronary artery disease, status post coronary bypassing graft in September 2009. His LV function is preserved. He was also found to have a DVT on that admission and was placed on Coumadin. There is a strong family history of protein S deficiency. The patient was seen by Dr. Marin Olp and Coumadin for one year was recommended. Note he did have blood drawn after discontinuing Coumadin. He did not have a protein S or protein C deficiency. Nuclear study February 2016 showed ejection fraction 54%. There was diaphragmatic attenuation but no ischemia. Abdominal ultrasound August 2020 showed no aneurysm.  Carotid blurs January 2023 showed 1 to 49% bilateral stenosis.  Since I last saw him, the patient has dyspnea with more extreme activities but not with routine activities. It is relieved with rest. It is not associated with chest pain. There is no orthopnea, PND or pedal edema. There is no syncope or palpitations. There is no exertional chest pain.   Current Outpatient Medications  Medication Sig Dispense Refill   lisinopril (ZESTRIL) 40 MG tablet TAKE 1 TABLET BY MOUTH DAILY 100 tablet 3   metoprolol tartrate (LOPRESSOR) 100 MG tablet TAKE 1 TABLET BY MOUTH TWICE  DAILY 200 tablet 2   rosuvastatin (CRESTOR) 40 MG tablet TAKE 1 TABLET BY MOUTH DAILY 100 tablet 2   aspirin 81 MG EC tablet Take 81 mg by mouth daily.       CALCIUM PO Take 1 tablet by mouth daily.     doxazosin (CARDURA) 2 MG tablet Take 2 mg by mouth daily.     Empagliflozin-metFORMIN HCl (SYNJARDY) 04-999 MG TABS Take 1 tablet by mouth daily.     folic acid (FOLVITE) 381 MCG tablet Take 800 mcg by mouth daily.       furosemide (LASIX) 40 MG tablet TAKE 1 AND 1/2 TABLETS BY MOUTH  DAILY 150 tablet 2   potassium chloride SA (KLOR-CON M) 20 MEQ tablet TAKE 1 TABLET BY MOUTH DAILY 100 tablet 2   Semaglutide (RYBELSUS) 3 MG TABS Take 1 tablet by mouth daily.     No current facility-administered medications for this visit.      Past Medical History:  Diagnosis Date   Atrial fibrillation ([place])    post CABG   COPD (chronic obstructive pulmonary disease) (H[facility])    Coronary atherosclerosis of artery bypass graft    DVT (deep venous thrombosis) (H[facility])    Esophageal reflux    History of protein S deficiency    family   Hypertension    Left renal mass    history. status post resection with results being benign   Pure hypercholesterolemia    Regional enteritis of unspecified site    with rectal bleeding-dx 3 years ago   Unspecified cerebral artery occlusion with cerebral infarction     Past Surgical History:  Procedure Laterality Date   CORONARY ARTERY BYPASS GRAFT  08/2008   with a LIMA to the LAD, vein graft to the diagonal, saphenous vein graft to the acute marginal and a saphenous vein graft to the PDA.   Prior Resection of a Renal Mass     REPLACEMENT TOTAL KNEE BILATERAL  11/08 and 6/08    Social History   Socioeconomic History   Marital status: Married    Spouse name: Not on file   Number of children: Not on file   Years of education: Not on file   Highest education level: Not on file  Occupational History   Occupation: Full Time  Upholsterer  Tobacco Use   Smoking status: Former    Packs/day: 1.00    Years: 50.00    Total pack years: 50.00    Types: Cigarettes    Quit date: 12/13/2004    Years since quitting: 18.1   Smokeless tobacco: Former    Types: Chew   Tobacco comments:    Chew past 3 years. now resolved  Substance and Sexual Activity   Alcohol use: No   Drug use: No   Sexual activity: Not on file  Other Topics Concern   Not on file  Social History Narrative   Lives in [place]   Social Determinants of Health   Financial Resource Strain: Not on file  Food Insecurity: Not on file  Transportation Needs: Not on file  Physical Activity: Not on file  Stress: Not on file  Social Connections: Not on file  Intimate Partner Violence: Not on file    Family History  Problem  Relation Age of Onset   Protein S deficiency Mother        died of CVA    ROS: no fevers or chills, productive cough, hemoptysis, dysphasia, odynophagia, melena, hematochezia, dysuria, hematuria, rash, seizure activity, orthopnea, PND, pedal edema, claudication. Remaining systems are negative.  Physical Exam: Well-developed well-nourished in no acute distress.  Skin is warm and dry.  HEENT is normal.  Neck is supple.  Chest is clear to auscultation with normal expansion.  Cardiovascular exam is regular rate and rhythm.  Abdominal exam nontender or distended. No masses palpated. Extremities show no edema. neuro grossly intact  ECG-normal sinus rhythm at a rate of 81, no ST changes.  Personally reviewed  A/P  1 coronary artery disease status post coronary bypass and graft-patient denies chest pain.  Continue medical therapy with aspirin and statin.  2 chronic diastolic congestive heart failure-he is euvolemic.  Continue diuretic at present dose.  3 carotid artery disease-mild on recent Dopplers.  Continue medical therapy.  4 hypertension blood pressure controlled.  Continue present medications.  5 hyperlipidemia-continue statin.  6 dyspnea-felt to be multifactorial including COPD and chronic diastolic congestive heart failure.  Kirk Ruths, MD

## 2023-01-19 ENCOUNTER — Ambulatory Visit: Payer: Medicare Other | Admitting: Cardiology

## 2023-01-20 ENCOUNTER — Ambulatory Visit: Payer: Medicare Other | Attending: Cardiology | Admitting: Cardiology

## 2023-01-20 ENCOUNTER — Encounter: Payer: Self-pay | Admitting: Cardiology

## 2023-01-20 VITALS — BP 138/76 | HR 81 | Ht 66.0 in | Wt 199.2 lb

## 2023-01-20 DIAGNOSIS — I5032 Chronic diastolic (congestive) heart failure: Secondary | ICD-10-CM

## 2023-01-20 DIAGNOSIS — I25810 Atherosclerosis of coronary artery bypass graft(s) without angina pectoris: Secondary | ICD-10-CM

## 2023-01-20 DIAGNOSIS — E7800 Pure hypercholesterolemia, unspecified: Secondary | ICD-10-CM | POA: Diagnosis not present

## 2023-01-20 DIAGNOSIS — I10 Essential (primary) hypertension: Secondary | ICD-10-CM

## 2023-01-20 LAB — LIPID PANEL
Chol/HDL Ratio: 2.9 ratio (ref 0.0–5.0)
Cholesterol, Total: 115 mg/dL (ref 100–199)
HDL: 39 mg/dL — ABNORMAL LOW (ref >39)
LDL Chol Calc (NIH): 48 mg/dL (ref 0–99)
Triglycerides: 166 mg/dL — ABNORMAL HIGH (ref 0–149)
VLDL Cholesterol Cal: 28 mg/dL (ref 5–40)

## 2023-01-20 NOTE — Patient Instructions (Signed)
Medication Instructions:   Your physician recommends that you continue on your current medications as directed. Please refer to the Current Medication list given to you today.  *If you need a refill on your cardiac medications before your next appointment, please call your pharmacy*  Lab Work: Your physician recommends that you return for lab work TODAY:  [place] If you have labs (blood work) drawn today and your tests are completely normal, you will receive your results only by: [place] (if you have [place]) OR A paper copy in the mail If you have any lab test that is abnormal or we need to change your treatment, we will call you to review the results.  Testing/Procedures: NONE ordered at this time of appointment   Follow-Up: At [facility], you and your health needs are our priority.  As part of our continuing mission to provide you with exceptional heart care, we have created designated Provider Care Teams.  These Care Teams include your primary Cardiologist (physician) and Advanced Practice Providers (APPs -  Physician Assistants and Nurse Practitioners) who all work together to provide you with the care you need, when you need it.   Your next appointment:   6 month(s)  Provider:   Kirk Ruths, MD     Other Instructions

## 2023-01-24 ENCOUNTER — Encounter: Payer: Self-pay | Admitting: *Deleted

## 2023-05-06 ENCOUNTER — Other Ambulatory Visit: Payer: Self-pay | Admitting: Family Medicine

## 2023-05-06 ENCOUNTER — Encounter: Payer: Self-pay | Admitting: Family Medicine

## 2023-05-06 DIAGNOSIS — R634 Abnormal weight loss: Secondary | ICD-10-CM

## 2023-05-11 ENCOUNTER — Other Ambulatory Visit: Payer: Self-pay | Admitting: Family Medicine

## 2023-05-11 DIAGNOSIS — N183 Chronic kidney disease, stage 3 unspecified: Secondary | ICD-10-CM

## 2023-05-17 ENCOUNTER — Ambulatory Visit
Admission: RE | Admit: 2023-05-17 | Discharge: 2023-05-17 | Disposition: A | Discharge status: Home / Self Care | Payer: Medicare Other | Source: Ambulatory Visit | Attending: Family Medicine | Admitting: Family Medicine

## 2023-05-17 DIAGNOSIS — R634 Abnormal weight loss: Secondary | ICD-10-CM

## 2023-05-25 ENCOUNTER — Other Ambulatory Visit (HOSPITAL_COMMUNITY): Payer: Self-pay | Admitting: Family Medicine

## 2023-05-25 DIAGNOSIS — N184 Chronic kidney disease, stage 4 (severe): Secondary | ICD-10-CM

## 2023-05-27 ENCOUNTER — Ambulatory Visit (HOSPITAL_COMMUNITY)
Admission: RE | Admit: 2023-05-27 | Discharge: 2023-05-27 | Disposition: A | Discharge status: Home / Self Care | Payer: Medicare Other | Source: Ambulatory Visit | Attending: Cardiovascular Disease | Admitting: Cardiovascular Disease

## 2023-05-27 DIAGNOSIS — N184 Chronic kidney disease, stage 4 (severe): Secondary | ICD-10-CM | POA: Diagnosis present

## 2023-06-08 ENCOUNTER — Ambulatory Visit
Admission: RE | Admit: 2023-06-08 | Discharge: 2023-06-08 | Disposition: A | Discharge status: Home / Self Care | Payer: Medicare Other | Source: Ambulatory Visit | Attending: Family Medicine | Admitting: Family Medicine

## 2023-06-08 ENCOUNTER — Other Ambulatory Visit: Payer: Self-pay | Admitting: Family Medicine

## 2023-06-08 DIAGNOSIS — M542 Cervicalgia: Secondary | ICD-10-CM

## 2023-06-28 ENCOUNTER — Ambulatory Visit: Payer: Medicare Other | Admitting: Adult Health

## 2023-07-26 ENCOUNTER — Ambulatory Visit: Payer: Medicare Other | Admitting: Cardiology

## 2023-07-26 ENCOUNTER — Other Ambulatory Visit: Payer: Self-pay | Admitting: Cardiology

## 2023-07-26 DIAGNOSIS — E7800 Pure hypercholesterolemia, unspecified: Secondary | ICD-10-CM

## 2023-07-26 NOTE — Progress Notes (Signed)
HPI: FU coronary artery disease, status post coronary bypassing graft in September 2009. His LV function is preserved. He was also found to have a DVT on that admission and was placed on Coumadin. There is a strong family history of protein S deficiency. The patient was seen by Dr. Myna Hidalgo and Coumadin for one year was recommended. Note he did have blood drawn after discontinuing Coumadin. He did not have a protein S or protein C deficiency. Nuclear study February 2016 showed ejection fraction 54%. There was diaphragmatic attenuation but no ischemia. Abdominal ultrasound August 2020 showed no aneurysm.  Carotid dopplers January 2023 showed 1 to 49% bilateral stenosis.  Renal Dopplers June 2024 showed 1 to 69% left renal artery stenosis and no right renal artery stenosis since I last saw him, he is being seen for lung masses concerning for cancer.  Diagnosis pending.  He denies dyspnea, chest pain, palpitations or syncope.  Current Outpatient Medications  Medication Sig Dispense Refill   aspirin 81 MG EC tablet Take 81 mg by mouth daily.       CALCIUM PO Take 1 tablet by mouth daily.     doxazosin (CARDURA) 2 MG tablet Take 2 mg by mouth daily.     folic acid (FOLVITE) 800 MCG tablet Take 800 mcg by mouth daily.       furosemide (LASIX) 40 MG tablet TAKE 1 AND 1/2 TABLETS BY MOUTH  DAILY 150 tablet 2   lisinopril (ZESTRIL) 40 MG tablet TAKE 1 TABLET BY MOUTH DAILY 100 tablet 3   metoprolol tartrate (LOPRESSOR) 100 MG tablet TAKE 1 TABLET BY MOUTH TWICE  DAILY 100 tablet 3   potassium chloride SA (KLOR-CON M) 20 MEQ tablet TAKE 1 TABLET BY MOUTH DAILY 100 tablet 2   rosuvastatin (CRESTOR) 40 MG tablet TAKE 1 TABLET BY MOUTH DAILY 100 tablet 2   Empagliflozin-metFORMIN HCl (SYNJARDY) 04-999 MG TABS Take 1 tablet by mouth daily.     Semaglutide (RYBELSUS) 3 MG TABS Take 1 tablet by mouth daily. (Patient not taking: Reported on 08/03/2023)     No current facility-administered medications for this  visit.     Past Medical History:  Diagnosis Date   Atrial fibrillation ([facility])    post CABG   COPD (chronic obstructive pulmonary disease) (HCC)    Coronary atherosclerosis of artery bypass graft    DVT (deep venous thrombosis) (HCC)    Esophageal reflux    History of protein S deficiency    family   Hypertension    Left renal mass    history. status post resection with results being benign   Pure hypercholesterolemia    Regional enteritis of unspecified site    with rectal bleeding-dx 3 years ago   Unspecified cerebral artery occlusion with cerebral infarction     Past Surgical History:  Procedure Laterality Date   CORONARY ARTERY BYPASS GRAFT  08/2008   with a LIMA to the LAD, vein graft to the diagonal, saphenous vein graft to the acute marginal and a saphenous vein graft to the PDA.   Prior Resection of a Renal Mass     REPLACEMENT TOTAL KNEE BILATERAL  11/08 and 6/08    Social History   Socioeconomic History   Marital status: Married    Spouse name: Not on file   Number of children: Not on file   Years of education: Not on file   Highest education level: Not on file  Occupational History   Occupation: Full Time  Upholsterer  Tobacco Use   Smoking status: Former    Current packs/day: 0.00    Average packs/day: 1 pack/day for 50.0 years (50.0 ttl pk-yrs)    Types: Cigarettes    Start date: 12/13/1954    Quit date: 12/13/2004    Years since quitting: 18.6   Smokeless tobacco: Former    Types: Chew   Tobacco comments:    Chew past 3 years. now resolved  Substance and Sexual Activity   Alcohol use: No   Drug use: No   Sexual activity: Not on file  Other Topics Concern   Not on file  Social History Narrative   Lives in [place]   Social Determinants of Health   Financial Resource Strain: Not on file  Food Insecurity: Not on file  Transportation Needs: Not on file  Physical Activity: Not on file  Stress: Not on file  Social Connections: Unknown (06/08/2023)    [facility]   Social Network    Social Network: Not on file  Intimate Partner Violence: Unknown (06/08/2023)   Received from [facility]   HITS    Physically Hurt: Not on file    Insult or Talk Down To: Not on file    Threaten Physical Harm: Not on file    Scream or Curse: Not on file    Family History  Problem Relation Age of Onset   Protein S deficiency Mother        died of CVA    ROS: no fevers or chills, productive cough, hemoptysis, dysphasia, odynophagia, melena, hematochezia, dysuria, hematuria, rash, seizure activity, orthopnea, PND, pedal edema, claudication. Remaining systems are negative.  Physical Exam: Well-developed well-nourished in no acute distress.  Skin is warm and dry.  HEENT is normal.  Neck is supple.  Chest is clear to auscultation with normal expansion.  Cardiovascular exam is regular rate and rhythm.  Abdominal exam nontender or distended. No masses palpated. Extremities show no edema. neuro grossly intact    A/P  1 coronary artery disease status post coronary bypass graft-patient doing well with no chest pain.  Continue aspirin and statin.  2 hypertension-blood pressure elevated; however he checks this at home and it is typically controlled by his report.  I have asked him to follow this and we will advance regimen as needed.  3 hyperlipidemia-continue statin.  Check lipids and liver.  4 chronic diastolic congestive heart failure-patient is euvolemic on examination.  Continue diuretics.  Check potassium and renal function.  5 carotid artery disease-mild on most recent Dopplers.  6 dyspnea-this is felt to be multifactorial including a combination of COPD chronic diastolic congestive heart failure.  7 lung mass-being by pulmonary.  Olga Millers, MD

## 2023-08-03 ENCOUNTER — Ambulatory Visit: Payer: Medicare Other | Attending: Cardiology | Admitting: Cardiology

## 2023-08-03 ENCOUNTER — Encounter: Payer: Self-pay | Admitting: Cardiology

## 2023-08-03 VITALS — BP 183/78 | HR 68 | Ht 66.0 in | Wt 184.4 lb

## 2023-08-03 DIAGNOSIS — E7800 Pure hypercholesterolemia, unspecified: Secondary | ICD-10-CM

## 2023-08-03 DIAGNOSIS — I10 Essential (primary) hypertension: Secondary | ICD-10-CM | POA: Diagnosis not present

## 2023-08-03 DIAGNOSIS — I5032 Chronic diastolic (congestive) heart failure: Secondary | ICD-10-CM | POA: Diagnosis not present

## 2023-08-03 DIAGNOSIS — I25810 Atherosclerosis of coronary artery bypass graft(s) without angina pectoris: Secondary | ICD-10-CM

## 2023-08-03 NOTE — Patient Instructions (Signed)

## 2023-08-04 ENCOUNTER — Other Ambulatory Visit: Payer: Self-pay | Admitting: Cardiology

## 2023-08-04 LAB — LIPID PANEL
Chol/HDL Ratio: 2.5 ratio (ref 0.0–5.0)
Cholesterol, Total: 139 mg/dL (ref 100–199)
HDL: 55 mg/dL (ref >39)
LDL Chol Calc (NIH): 68 mg/dL (ref 0–99)
Triglycerides: 80 mg/dL (ref 0–149)
VLDL Cholesterol Cal: 16 mg/dL (ref 5–40)

## 2023-08-04 LAB — COMPREHENSIVE METABOLIC PANEL
ALT: 16 IU/L (ref 0–44)
AST: 16 IU/L (ref 0–40)
Albumin: 4.1 g/dL (ref 3.8–4.8)
Alkaline Phosphatase: 63 IU/L (ref 44–121)
BUN/Creatinine Ratio: 14 (ref 10–24)
BUN: 20 mg/dL (ref 8–27)
Bilirubin Total: 0.3 mg/dL (ref 0.0–1.2)
CO2: 26 mmol/L (ref 20–29)
Calcium: 9.4 mg/dL (ref 8.6–10.2)
Chloride: 103 mmol/L (ref 96–106)
Creatinine, Ser: 1.43 mg/dL — ABNORMAL HIGH (ref 0.76–1.27)
Globulin, Total: 2.6 g/dL (ref 1.5–4.5)
Glucose: 125 mg/dL — ABNORMAL HIGH (ref 70–99)
Potassium: 4.5 mmol/L (ref 3.5–5.2)
Sodium: 142 mmol/L (ref 134–144)
Total Protein: 6.7 g/dL (ref 6.0–8.5)
eGFR: 51 mL/min/{1.73_m2} — ABNORMAL LOW (ref >59)

## 2023-08-22 ENCOUNTER — Other Ambulatory Visit: Payer: Self-pay | Admitting: Cardiology

## 2023-10-31 ENCOUNTER — Other Ambulatory Visit: Payer: Self-pay | Admitting: Cardiology

## 2024-01-13 ENCOUNTER — Other Ambulatory Visit: Payer: Self-pay | Admitting: Cardiology

## 2024-06-05 ENCOUNTER — Telehealth: Payer: Self-pay | Admitting: Cardiology

## 2024-06-05 MED ORDER — POTASSIUM CHLORIDE CRYS ER 20 MEQ PO TBCR: EXTENDED_RELEASE_TABLET | ORAL | 0 refills | Status: DC

## 2024-06-05 NOTE — Progress Notes (Signed)
 HPI: FU coronary artery disease, status post coronary bypassing graft in September 2009. His LV function is preserved. He was also found to have a DVT on that admission and was placed on Coumadin. There is a strong family history of protein S deficiency. The patient was seen by Dr. Ennever and Coumadin for one year was recommended. Note he did have blood drawn after discontinuing Coumadin. He did not have a protein S or protein C deficiency. Nuclear study February 2016 showed ejection fraction 54%. There was diaphragmatic attenuation but no ischemia. Abdominal ultrasound August 2020 showed no aneurysm.  Carotid dopplers January 2023 showed 1 to 49% bilateral stenosis.  Renal Dopplers June 2024 showed 1 to 69% left renal artery stenosis and no right renal artery stenosis. Since I last saw him, the patient has dyspnea with more extreme activities but not with routine activities. It is relieved with rest. It is not associated with chest pain. There is no orthopnea, PND or pedal edema. There is no syncope or palpitations. There is no exertional chest pain.   Current Outpatient Medications  Medication Sig Dispense Refill   aspirin 81 MG EC tablet Take 81 mg by mouth daily.       CALCIUM  PO Take 1 tablet by mouth daily.     folic acid (FOLVITE) 800 MCG tablet Take 800 mcg by mouth daily.       furosemide  (LASIX ) 40 MG tablet TAKE 1 AND 1/2 TABLETS BY MOUTH  DAILY 150 tablet 2   lisinopril  (ZESTRIL ) 40 MG tablet TAKE 1 TABLET BY MOUTH DAILY 100 tablet 2   metoprolol  tartrate (LOPRESSOR ) 100 MG tablet TAKE 1 TABLET BY MOUTH TWICE  DAILY 200 tablet 2   potassium chloride  SA (KLOR-CON  M) 20 MEQ tablet TAKE 1 TABLET BY MOUTH  DAILY 100 tablet 0   rosuvastatin  (CRESTOR ) 40 MG tablet TAKE 1 TABLET BY MOUTH DAILY 100 tablet 2   sulfaSALAzine (AZULFIDINE) 500 MG tablet TAKE 2 TABLETS BY MOUTH TWICE DAILY. TAKE WITH FOLIC ACID     doxazosin (CARDURA) 2 MG tablet Take 2 mg by mouth daily.      Empagliflozin-metFORMIN HCl (SYNJARDY) 04-999 MG TABS Take 1 tablet by mouth daily.     Semaglutide (RYBELSUS) 3 MG TABS Take 1 tablet by mouth daily. (Patient not taking: Reported on 06/12/2024)     No current facility-administered medications for this visit.     Past Medical History:  Diagnosis Date   Atrial fibrillation (HCC)    post CABG   COPD (chronic obstructive pulmonary disease) (HCC)    Coronary atherosclerosis of artery bypass graft    DVT (deep venous thrombosis) (HCC)    Esophageal reflux    History of protein S deficiency    family   Hypertension    Left renal mass    history. status post resection with results being benign   Pure hypercholesterolemia    Regional enteritis of unspecified site    with rectal bleeding-dx 3 years ago   Unspecified cerebral artery occlusion with cerebral infarction     Past Surgical History:  Procedure Laterality Date   CORONARY ARTERY BYPASS GRAFT  08/2008   with a LIMA to the LAD, vein graft to the diagonal, saphenous vein graft to the acute marginal and a saphenous vein graft to the PDA.   Prior Resection of a Renal Mass     REPLACEMENT TOTAL KNEE BILATERAL  11/08 and 6/08    Social History   Socioeconomic History  Marital status: Married    Spouse name: Not on file   Number of children: Not on file   Years of education: Not on file   Highest education level: Not on file  Occupational History   Occupation: Full Time Upholsterer  Tobacco Use   Smoking status: Former    Current packs/day: 0.00    Average packs/day: 1 pack/day for 50.0 years (50.0 ttl pk-yrs)    Types: Cigarettes    Start date: 12/13/1954    Quit date: 12/13/2004    Years since quitting: 19.5   Smokeless tobacco: Former    Types: Chew   Tobacco comments:    Chew past 3 years. now resolved  Substance and Sexual Activity   Alcohol use: No   Drug use: No   Sexual activity: Not on file  Other Topics Concern   Not on file  Social History Narrative   Lives  in [place]   Social Drivers of Health   Financial Resource Strain: Not on file  Food Insecurity: Not on file  Transportation Needs: Not on file  Physical Activity: Not on file  Stress: Not on file  Social Connections: Unknown (06/08/2023)   [facility]   Social Network    Social Network: Not on file  Intimate Partner Violence: Unknown (06/08/2023)   Received from [facility]   HITS    Physically Hurt: Not on file    Insult or Talk Down To: Not on file    Threaten Physical Harm: Not on file    Scream or Curse: Not on file    Family History  Problem Relation Age of Onset   Protein S deficiency Mother        died of CVA    ROS: no fevers or chills, productive cough, hemoptysis, dysphasia, odynophagia, melena, hematochezia, dysuria, hematuria, rash, seizure activity, orthopnea, PND, pedal edema, claudication. Remaining systems are negative.  Physical Exam: Well-developed well-nourished in no acute distress.  Skin is warm and dry.  HEENT is normal.  Neck is supple.  Chest is clear to auscultation with normal expansion.  Cardiovascular exam is regular rate and rhythm.  Abdominal exam nontender or distended. No masses palpated. Extremities show no edema. neuro grossly intact  EKG Interpretation Date/Time:  Tuesday June 12 2024 16:16:50 EDT Ventricular Rate:  69 PR Interval:  166 QRS Duration:  92 QT Interval:  392 QTC Calculation: 420 R Axis:   18  Text Interpretation: Normal sinus rhythm Normal ECG Confirmed by Pietro Rogue ([id number]) on 06/12/2024 4:35:54 PM    A/P  1 coronary artery disease status post coronary artery bypass graft-patient denies chest pain.  Continue medical therapy with aspirin and statin.  2 hypertension-patient's blood pressure is controlled.  Continue present medications.    3 hyperlipidemia-continue statin.  4 chronic diastolic congestive heart failure-patient remains euvolemic on examination.  Continue diuretic at present  dose.  5 carotid artery disease-mild on most recent Dopplers.  6 dyspnea-patient has chronic dyspnea felt to be multifactorial including combination of diastolic congestive heart failure and COPD.  7 non-small cell lung cancer-managed by oncology.  Rogue Pietro, MD

## 2024-06-05 NOTE — Telephone Encounter (Signed)
*  STAT* If patient is at the pharmacy, call can be transferred to refill team.   1. Which medications need to be refilled? (please list name of each medication and dose if known) potassium chloride  SA (KLOR-CON  M) 20 MEQ tablet   2. Which pharmacy/location (including street and city if local pharmacy) is medication to be sent to?  [facility] - [place], [place] - [address]      3. Do they need a 30 day or 90 day supply? 90 day

## 2024-06-05 NOTE — Telephone Encounter (Signed)
 Pt's medication was sent to pt's pharmacy as requested. Confirmation received.

## 2024-06-12 ENCOUNTER — Encounter: Payer: Self-pay | Admitting: Cardiology

## 2024-06-12 ENCOUNTER — Ambulatory Visit: Payer: Self-pay | Attending: Cardiology | Admitting: Cardiology

## 2024-06-12 VITALS — BP 124/64 | HR 69 | Ht 66.0 in | Wt 198.0 lb

## 2024-06-12 DIAGNOSIS — I25810 Atherosclerosis of coronary artery bypass graft(s) without angina pectoris: Secondary | ICD-10-CM

## 2024-06-12 DIAGNOSIS — I5032 Chronic diastolic (congestive) heart failure: Secondary | ICD-10-CM | POA: Diagnosis not present

## 2024-06-12 DIAGNOSIS — I10 Essential (primary) hypertension: Secondary | ICD-10-CM | POA: Diagnosis not present

## 2024-06-12 DIAGNOSIS — E7800 Pure hypercholesterolemia, unspecified: Secondary | ICD-10-CM

## 2024-06-12 NOTE — Patient Instructions (Signed)

## 2024-07-10 ENCOUNTER — Telehealth: Payer: Self-pay | Admitting: Cardiology

## 2024-07-10 NOTE — Telephone Encounter (Signed)
*  STAT* If patient is at the pharmacy, call can be transferred to refill team.   1. Which medications need to be refilled? (please list name of each medication and dose if known) furosemide  (LASIX ) 40 MG tablet   [facility]   4. Which pharmacy/location (including street and city if local pharmacy) is medication to be sent to?  [facility] - [place], [place] - [address] Phone: [phone]  Fax: [phone]       5. Do they need a 30 day or 90 day supply? 90

## 2024-07-12 ENCOUNTER — Other Ambulatory Visit (HOSPITAL_COMMUNITY): Payer: Self-pay

## 2024-07-12 MED ORDER — FUROSEMIDE 40 MG PO TABS
60.0000 mg | ORAL_TABLET | Freq: Every day | ORAL | 3 refills | Status: DC
  Filled 2024-07-12: qty 135, 90d supply, fill #0

## 2024-07-17 ENCOUNTER — Telehealth: Payer: Self-pay | Admitting: Cardiology

## 2024-07-17 ENCOUNTER — Other Ambulatory Visit (HOSPITAL_COMMUNITY): Payer: Self-pay

## 2024-07-17 MED ORDER — FUROSEMIDE 40 MG PO TABS: 60.0000 mg | ORAL_TABLET | Freq: Every day | ORAL | 3 refills | Status: AC

## 2024-07-17 NOTE — Telephone Encounter (Signed)
 Pt c/o medication issue:  1. Name of Medication: furosemide  (LASIX ) 40 MG tablet   2. How are you currently taking this medication (dosage and times per day)?   3. Are you having a reaction (difficulty breathing--STAT)?   4. What is your medication issue? Pt can't drive to [place] to get refill and would like the medication to be sent over to [facility] [place], [place] - [address]

## 2024-07-17 NOTE — Telephone Encounter (Signed)
 Called pt and informed of RX sent.

## 2024-07-17 NOTE — Telephone Encounter (Signed)
 Refill of Furosemide  sent to preferred pharmacy.

## 2024-07-18 ENCOUNTER — Other Ambulatory Visit (HOSPITAL_COMMUNITY): Payer: Self-pay

## 2024-07-23 ENCOUNTER — Other Ambulatory Visit (HOSPITAL_COMMUNITY): Payer: Self-pay

## 2024-09-10 ENCOUNTER — Other Ambulatory Visit: Payer: Self-pay | Admitting: Cardiology

## 2025-02-10 DEATH — deceased
